# Patient Record
Sex: Male | Born: 1965
Health system: Southern US, Community
[De-identification: ages and names within clinical notes are randomized; demographics above are authoritative.]

## PROBLEM LIST (undated history)

## (undated) DIAGNOSIS — I10 Essential (primary) hypertension: Secondary | ICD-10-CM

## (undated) DIAGNOSIS — F1020 Alcohol dependence, uncomplicated: Secondary | ICD-10-CM

## (undated) HISTORY — PX: ORTHOPEDIC SURGERY: SHX850

---

## 2010-07-25 ENCOUNTER — Emergency Department (HOSPITAL_COMMUNITY)
Admission: EM | Admit: 2010-07-25 | Discharge: 2010-07-25 | Payer: Self-pay | Source: Home / Self Care | Admitting: Emergency Medicine

## 2011-02-14 ENCOUNTER — Emergency Department (HOSPITAL_COMMUNITY): Payer: BC Managed Care – PPO

## 2011-02-14 ENCOUNTER — Emergency Department (HOSPITAL_COMMUNITY)
Admission: EM | Admit: 2011-02-14 | Discharge: 2011-02-14 | Disposition: A | Discharge status: Home / Self Care | Payer: BC Managed Care – PPO | Attending: Emergency Medicine | Admitting: Emergency Medicine

## 2011-02-14 DIAGNOSIS — R0602 Shortness of breath: Secondary | ICD-10-CM | POA: Insufficient documentation

## 2011-02-14 DIAGNOSIS — R002 Palpitations: Secondary | ICD-10-CM | POA: Insufficient documentation

## 2011-02-14 DIAGNOSIS — I10 Essential (primary) hypertension: Secondary | ICD-10-CM | POA: Insufficient documentation

## 2011-02-14 DIAGNOSIS — R079 Chest pain, unspecified: Secondary | ICD-10-CM | POA: Insufficient documentation

## 2011-02-14 DIAGNOSIS — F1010 Alcohol abuse, uncomplicated: Secondary | ICD-10-CM | POA: Insufficient documentation

## 2011-02-14 LAB — HEPATIC FUNCTION PANEL
Albumin: 4.3 g/dL (ref 3.5–5.2)
Total Bilirubin: 1.1 mg/dL (ref 0.3–1.2)
Total Protein: 8.2 g/dL (ref 6.0–8.3)

## 2011-02-14 LAB — POCT I-STAT, CHEM 8
Chloride: 97 mEq/L (ref 96–112)
HCT: 57 % — ABNORMAL HIGH (ref 39.0–52.0)
Potassium: 4.2 mEq/L (ref 3.5–5.1)

## 2011-02-14 LAB — DIFFERENTIAL
Lymphocytes Relative: 15 % (ref 12–46)
Lymphs Abs: 1.4 10*3/uL (ref 0.7–4.0)
Neutrophils Relative %: 75 % (ref 43–77)

## 2011-02-14 LAB — CK TOTAL AND CKMB (NOT AT ARMC): Relative Index: 2.3 (ref 0.0–2.5)

## 2011-02-14 LAB — TROPONIN I: Troponin I: <0.3 ng/mL (ref <0.30)

## 2011-02-14 LAB — CBC
HCT: 49.6 % (ref 39.0–52.0)
MCV: 94.7 fL (ref 78.0–100.0)
RBC: 5.24 MIL/uL (ref 4.22–5.81)
WBC: 9.5 10*3/uL (ref 4.0–10.5)

## 2016-12-14 DIAGNOSIS — E7800 Pure hypercholesterolemia, unspecified: Secondary | ICD-10-CM | POA: Diagnosis not present

## 2016-12-14 DIAGNOSIS — Z0000 Encounter for general adult medical examination without abnormal findings: Secondary | ICD-10-CM | POA: Diagnosis not present

## 2017-04-22 DIAGNOSIS — I10 Essential (primary) hypertension: Secondary | ICD-10-CM | POA: Diagnosis not present

## 2017-11-01 DIAGNOSIS — I10 Essential (primary) hypertension: Secondary | ICD-10-CM | POA: Diagnosis not present

## 2018-01-02 DIAGNOSIS — I10 Essential (primary) hypertension: Secondary | ICD-10-CM | POA: Diagnosis not present

## 2018-01-02 DIAGNOSIS — R0789 Other chest pain: Secondary | ICD-10-CM | POA: Diagnosis not present

## 2018-01-02 DIAGNOSIS — R5383 Other fatigue: Secondary | ICD-10-CM | POA: Diagnosis not present

## 2018-01-24 DIAGNOSIS — I10 Essential (primary) hypertension: Secondary | ICD-10-CM | POA: Diagnosis not present

## 2018-01-24 DIAGNOSIS — R079 Chest pain, unspecified: Secondary | ICD-10-CM | POA: Diagnosis not present

## 2018-01-24 DIAGNOSIS — R5383 Other fatigue: Secondary | ICD-10-CM | POA: Diagnosis not present

## 2018-01-31 DIAGNOSIS — R079 Chest pain, unspecified: Secondary | ICD-10-CM | POA: Diagnosis not present

## 2018-02-02 DIAGNOSIS — I10 Essential (primary) hypertension: Secondary | ICD-10-CM | POA: Diagnosis not present

## 2019-11-19 ENCOUNTER — Ambulatory Visit: Payer: Self-pay | Attending: Internal Medicine

## 2019-11-19 DIAGNOSIS — Z23 Encounter for immunization: Secondary | ICD-10-CM

## 2019-11-19 NOTE — Progress Notes (Signed)
   Covid-19 Vaccination Clinic  Name:  Thierno Hun    MRN: [id number] DOB: [birth year]  11/19/2019  Mr. Crissman was observed post Covid-19 immunization for 15 minutes without incident. He was provided with Vaccine Information Sheet and instruction to access the V-Safe system.   Mr. Delay was instructed to call 911 with any severe reactions post vaccine: Marland Kitchen Difficulty breathing  . Swelling of face and throat  . A fast heartbeat  . A bad rash all over body  . Dizziness and weakness   Immunizations Administered    Name Date Dose VIS Date Route   Pfizer COVID-19 Vaccine 11/19/2019  3:10 PM 0.3 mL 07/27/2019 Intramuscular   Manufacturer: [employer], [employer]   Lot: [id number]   NDC: [address]-9

## 2019-12-12 ENCOUNTER — Ambulatory Visit: Payer: Self-pay | Attending: Internal Medicine

## 2019-12-12 DIAGNOSIS — Z23 Encounter for immunization: Secondary | ICD-10-CM

## 2019-12-12 NOTE — Progress Notes (Signed)
   Covid-19 Vaccination Clinic  Name:  Paul [name]    MRN: [id number] DOB: 12/09/1965  12/12/2019  Paul [name] was observed post Covid-19 immunization for 15 minutes without incident. He was provided with Vaccine Information Sheet and instruction to access the V-Safe system.   Paul [name] was instructed to call 911 with any severe reactions post vaccine: Marland Kitchen Difficulty breathing  . Swelling of face and throat  . A fast heartbeat  . A bad rash all over body  . Dizziness and weakness   Immunizations Administered    Name Date Dose VIS Date Route   Pfizer COVID-19 Vaccine 12/12/2019  2:57 PM 0.3 mL 10/10/2018 Intramuscular   Manufacturer: [employer], [employer]   Lot: [id number]   NDC: [address]-4

## 2020-08-20 ENCOUNTER — Ambulatory Visit (HOSPITAL_BASED_OUTPATIENT_CLINIC_OR_DEPARTMENT_OTHER)
Admission: RE | Admit: 2020-08-20 | Discharge: 2020-08-20 | Disposition: A | Discharge status: Home / Self Care | Payer: BC Managed Care – PPO | Source: Ambulatory Visit | Attending: Family Medicine | Admitting: Family Medicine

## 2020-08-20 ENCOUNTER — Encounter (HOSPITAL_COMMUNITY): Payer: Self-pay | Admitting: *Deleted

## 2020-08-20 ENCOUNTER — Inpatient Hospital Stay (HOSPITAL_COMMUNITY)
Admission: EM | Admit: 2020-08-20 | Discharge: 2020-08-22 | DRG: 433 | Disposition: A | Discharge status: Home / Self Care | Payer: BC Managed Care – PPO | Attending: Internal Medicine | Admitting: Internal Medicine

## 2020-08-20 ENCOUNTER — Other Ambulatory Visit: Payer: Self-pay

## 2020-08-20 ENCOUNTER — Other Ambulatory Visit (HOSPITAL_BASED_OUTPATIENT_CLINIC_OR_DEPARTMENT_OTHER): Payer: Self-pay | Admitting: Family Medicine

## 2020-08-20 DIAGNOSIS — R160 Hepatomegaly, not elsewhere classified: Secondary | ICD-10-CM | POA: Diagnosis not present

## 2020-08-20 DIAGNOSIS — I10 Essential (primary) hypertension: Secondary | ICD-10-CM | POA: Diagnosis not present

## 2020-08-20 DIAGNOSIS — K828 Other specified diseases of gallbladder: Secondary | ICD-10-CM | POA: Diagnosis not present

## 2020-08-20 DIAGNOSIS — R162 Hepatomegaly with splenomegaly, not elsewhere classified: Secondary | ICD-10-CM | POA: Diagnosis present

## 2020-08-20 DIAGNOSIS — R1011 Right upper quadrant pain: Secondary | ICD-10-CM

## 2020-08-20 DIAGNOSIS — Z800 Family history of malignant neoplasm of digestive organs: Secondary | ICD-10-CM | POA: Diagnosis not present

## 2020-08-20 DIAGNOSIS — Z125 Encounter for screening for malignant neoplasm of prostate: Secondary | ICD-10-CM | POA: Diagnosis not present

## 2020-08-20 DIAGNOSIS — Z79899 Other long term (current) drug therapy: Secondary | ICD-10-CM

## 2020-08-20 DIAGNOSIS — F17200 Nicotine dependence, unspecified, uncomplicated: Secondary | ICD-10-CM | POA: Diagnosis not present

## 2020-08-20 DIAGNOSIS — Z20822 Contact with and (suspected) exposure to covid-19: Secondary | ICD-10-CM | POA: Diagnosis present

## 2020-08-20 DIAGNOSIS — F1021 Alcohol dependence, in remission: Secondary | ICD-10-CM | POA: Diagnosis not present

## 2020-08-20 DIAGNOSIS — R7401 Elevation of levels of liver transaminase levels: Secondary | ICD-10-CM | POA: Diagnosis not present

## 2020-08-20 DIAGNOSIS — R932 Abnormal findings on diagnostic imaging of liver and biliary tract: Secondary | ICD-10-CM | POA: Diagnosis not present

## 2020-08-20 DIAGNOSIS — K7400 Hepatic fibrosis, unspecified: Secondary | ICD-10-CM | POA: Diagnosis present

## 2020-08-20 DIAGNOSIS — I8500 Esophageal varices without bleeding: Secondary | ICD-10-CM | POA: Diagnosis not present

## 2020-08-20 DIAGNOSIS — I8510 Secondary esophageal varices without bleeding: Secondary | ICD-10-CM | POA: Diagnosis present

## 2020-08-20 DIAGNOSIS — F10239 Alcohol dependence with withdrawal, unspecified: Secondary | ICD-10-CM | POA: Diagnosis not present

## 2020-08-20 DIAGNOSIS — K8020 Calculus of gallbladder without cholecystitis without obstruction: Secondary | ICD-10-CM | POA: Diagnosis not present

## 2020-08-20 DIAGNOSIS — K766 Portal hypertension: Secondary | ICD-10-CM | POA: Diagnosis not present

## 2020-08-20 DIAGNOSIS — Z8052 Family history of malignant neoplasm of bladder: Secondary | ICD-10-CM | POA: Diagnosis not present

## 2020-08-20 DIAGNOSIS — K700 Alcoholic fatty liver: Secondary | ICD-10-CM | POA: Diagnosis not present

## 2020-08-20 DIAGNOSIS — K7030 Alcoholic cirrhosis of liver without ascites: Secondary | ICD-10-CM | POA: Diagnosis not present

## 2020-08-20 DIAGNOSIS — R109 Unspecified abdominal pain: Secondary | ICD-10-CM | POA: Diagnosis not present

## 2020-08-20 DIAGNOSIS — K8000 Calculus of gallbladder with acute cholecystitis without obstruction: Secondary | ICD-10-CM | POA: Diagnosis not present

## 2020-08-20 DIAGNOSIS — E871 Hypo-osmolality and hyponatremia: Secondary | ICD-10-CM | POA: Diagnosis present

## 2020-08-20 DIAGNOSIS — F1090 Alcohol use, unspecified, uncomplicated: Secondary | ICD-10-CM

## 2020-08-20 DIAGNOSIS — K810 Acute cholecystitis: Secondary | ICD-10-CM | POA: Diagnosis present

## 2020-08-20 DIAGNOSIS — Z7289 Other problems related to lifestyle: Secondary | ICD-10-CM

## 2020-08-20 DIAGNOSIS — R7989 Other specified abnormal findings of blood chemistry: Secondary | ICD-10-CM | POA: Diagnosis present

## 2020-08-20 DIAGNOSIS — F1020 Alcohol dependence, uncomplicated: Secondary | ICD-10-CM | POA: Diagnosis not present

## 2020-08-20 DIAGNOSIS — K709 Alcoholic liver disease, unspecified: Principal | ICD-10-CM | POA: Diagnosis present

## 2020-08-20 DIAGNOSIS — K760 Fatty (change of) liver, not elsewhere classified: Secondary | ICD-10-CM | POA: Diagnosis not present

## 2020-08-20 DIAGNOSIS — F17290 Nicotine dependence, other tobacco product, uncomplicated: Secondary | ICD-10-CM | POA: Diagnosis not present

## 2020-08-20 DIAGNOSIS — Z825 Family history of asthma and other chronic lower respiratory diseases: Secondary | ICD-10-CM | POA: Diagnosis not present

## 2020-08-20 DIAGNOSIS — K7010 Alcoholic hepatitis without ascites: Secondary | ICD-10-CM | POA: Diagnosis not present

## 2020-08-20 DIAGNOSIS — R17 Unspecified jaundice: Secondary | ICD-10-CM | POA: Diagnosis not present

## 2020-08-20 HISTORY — DX: Essential (primary) hypertension: I10

## 2020-08-20 HISTORY — DX: Alcohol dependence, uncomplicated: F10.20

## 2020-08-20 LAB — COMPREHENSIVE METABOLIC PANEL
ALT: 45 U/L — ABNORMAL HIGH (ref 0–44)
AST: 124 U/L — ABNORMAL HIGH (ref 15–41)
Albumin: 2.6 g/dL — ABNORMAL LOW (ref 3.5–5.0)
Alkaline Phosphatase: 229 U/L — ABNORMAL HIGH (ref 38–126)
Anion gap: 11 (ref 5–15)
BUN: 9 mg/dL (ref 6–20)
CO2: 26 mmol/L (ref 22–32)
Calcium: 8.6 mg/dL — ABNORMAL LOW (ref 8.9–10.3)
Chloride: 94 mmol/L — ABNORMAL LOW (ref 98–111)
Creatinine, Ser: 0.87 mg/dL (ref 0.61–1.24)
GFR, Estimated: >60 mL/min (ref >60)
Glucose, Bld: 94 mg/dL (ref 70–99)
Potassium: 4 mmol/L (ref 3.5–5.1)
Sodium: 131 mmol/L — ABNORMAL LOW (ref 135–145)
Total Bilirubin: 13 mg/dL — ABNORMAL HIGH (ref 0.3–1.2)
Total Protein: 7.3 g/dL (ref 6.5–8.1)

## 2020-08-20 LAB — CBC
HCT: 41.4 % (ref 39.0–52.0)
Hemoglobin: 14.9 g/dL (ref 13.0–17.0)
MCH: 32.3 pg (ref 26.0–34.0)
MCHC: 36 g/dL (ref 30.0–36.0)
MCV: 89.6 fL (ref 80.0–100.0)
Platelets: 279 10*3/uL (ref 150–400)
RBC: 4.62 MIL/uL (ref 4.22–5.81)
RDW: 17.2 % — ABNORMAL HIGH (ref 11.5–15.5)
WBC: 7.7 10*3/uL (ref 4.0–10.5)
nRBC: 0 % (ref 0.0–0.2)

## 2020-08-20 LAB — URINALYSIS, ROUTINE W REFLEX MICROSCOPIC
Glucose, UA: NEGATIVE mg/dL
Hgb urine dipstick: NEGATIVE
Ketones, ur: NEGATIVE mg/dL
Leukocytes,Ua: NEGATIVE
Nitrite: NEGATIVE
Protein, ur: 30 mg/dL — AB
Specific Gravity, Urine: 1.024 (ref 1.005–1.030)
pH: 5 (ref 5.0–8.0)

## 2020-08-20 LAB — LIPASE, BLOOD: Lipase: 30 U/L (ref 11–51)

## 2020-08-20 NOTE — ED Triage Notes (Signed)
The pt drinks alcohol every day he last drank any was 3 days ago  He saw his doctor today that sent him here after some abnormal labs  He reports abd pain and his skin looks jaundice  No n v or diarrhea

## 2020-08-21 ENCOUNTER — Inpatient Hospital Stay (HOSPITAL_COMMUNITY): Payer: BC Managed Care – PPO

## 2020-08-21 ENCOUNTER — Encounter (HOSPITAL_COMMUNITY): Payer: Self-pay | Admitting: Internal Medicine

## 2020-08-21 DIAGNOSIS — I8510 Secondary esophageal varices without bleeding: Secondary | ICD-10-CM | POA: Diagnosis present

## 2020-08-21 DIAGNOSIS — K700 Alcoholic fatty liver: Secondary | ICD-10-CM | POA: Diagnosis not present

## 2020-08-21 DIAGNOSIS — F1020 Alcohol dependence, uncomplicated: Secondary | ICD-10-CM | POA: Diagnosis not present

## 2020-08-21 DIAGNOSIS — K766 Portal hypertension: Secondary | ICD-10-CM | POA: Diagnosis present

## 2020-08-21 DIAGNOSIS — Z800 Family history of malignant neoplasm of digestive organs: Secondary | ICD-10-CM | POA: Diagnosis not present

## 2020-08-21 DIAGNOSIS — K709 Alcoholic liver disease, unspecified: Secondary | ICD-10-CM | POA: Diagnosis present

## 2020-08-21 DIAGNOSIS — R7989 Other specified abnormal findings of blood chemistry: Secondary | ICD-10-CM | POA: Diagnosis present

## 2020-08-21 DIAGNOSIS — Z20822 Contact with and (suspected) exposure to covid-19: Secondary | ICD-10-CM | POA: Diagnosis present

## 2020-08-21 DIAGNOSIS — K8000 Calculus of gallbladder with acute cholecystitis without obstruction: Secondary | ICD-10-CM | POA: Diagnosis present

## 2020-08-21 DIAGNOSIS — K828 Other specified diseases of gallbladder: Secondary | ICD-10-CM | POA: Diagnosis present

## 2020-08-21 DIAGNOSIS — I10 Essential (primary) hypertension: Secondary | ICD-10-CM | POA: Diagnosis present

## 2020-08-21 DIAGNOSIS — R162 Hepatomegaly with splenomegaly, not elsewhere classified: Secondary | ICD-10-CM | POA: Diagnosis present

## 2020-08-21 DIAGNOSIS — K810 Acute cholecystitis: Secondary | ICD-10-CM | POA: Diagnosis present

## 2020-08-21 DIAGNOSIS — Z825 Family history of asthma and other chronic lower respiratory diseases: Secondary | ICD-10-CM | POA: Diagnosis not present

## 2020-08-21 DIAGNOSIS — K760 Fatty (change of) liver, not elsewhere classified: Secondary | ICD-10-CM | POA: Diagnosis present

## 2020-08-21 DIAGNOSIS — F17200 Nicotine dependence, unspecified, uncomplicated: Secondary | ICD-10-CM | POA: Diagnosis present

## 2020-08-21 DIAGNOSIS — R109 Unspecified abdominal pain: Secondary | ICD-10-CM | POA: Diagnosis present

## 2020-08-21 DIAGNOSIS — K7400 Hepatic fibrosis, unspecified: Secondary | ICD-10-CM | POA: Diagnosis present

## 2020-08-21 DIAGNOSIS — Z8052 Family history of malignant neoplasm of bladder: Secondary | ICD-10-CM | POA: Diagnosis not present

## 2020-08-21 DIAGNOSIS — F17290 Nicotine dependence, other tobacco product, uncomplicated: Secondary | ICD-10-CM

## 2020-08-21 DIAGNOSIS — F10239 Alcohol dependence with withdrawal, unspecified: Secondary | ICD-10-CM | POA: Diagnosis present

## 2020-08-21 DIAGNOSIS — Z79899 Other long term (current) drug therapy: Secondary | ICD-10-CM | POA: Diagnosis not present

## 2020-08-21 DIAGNOSIS — E871 Hypo-osmolality and hyponatremia: Secondary | ICD-10-CM | POA: Diagnosis present

## 2020-08-21 LAB — RESP PANEL BY RT-PCR (FLU A&B, COVID) ARPGX2
Influenza A by PCR: NEGATIVE
Influenza B by PCR: NEGATIVE
SARS Coronavirus 2 by RT PCR: NEGATIVE

## 2020-08-21 LAB — HEPATITIS PANEL, ACUTE
HCV Ab: NONREACTIVE
Hep A IgM: NONREACTIVE
Hep B C IgM: NONREACTIVE
Hepatitis B Surface Ag: NONREACTIVE

## 2020-08-21 LAB — ACETAMINOPHEN LEVEL: Acetaminophen (Tylenol), Serum: <10 ug/mL — ABNORMAL LOW (ref 10–30)

## 2020-08-21 LAB — COMPREHENSIVE METABOLIC PANEL
ALT: 44 U/L (ref 0–44)
AST: 119 U/L — ABNORMAL HIGH (ref 15–41)
Albumin: 2.6 g/dL — ABNORMAL LOW (ref 3.5–5.0)
Alkaline Phosphatase: 209 U/L — ABNORMAL HIGH (ref 38–126)
Anion gap: 10 (ref 5–15)
BUN: 11 mg/dL (ref 6–20)
CO2: 24 mmol/L (ref 22–32)
Calcium: 8.4 mg/dL — ABNORMAL LOW (ref 8.9–10.3)
Chloride: 96 mmol/L — ABNORMAL LOW (ref 98–111)
Creatinine, Ser: 0.73 mg/dL (ref 0.61–1.24)
GFR, Estimated: >60 mL/min (ref >60)
Glucose, Bld: 90 mg/dL (ref 70–99)
Potassium: 3.8 mmol/L (ref 3.5–5.1)
Sodium: 130 mmol/L — ABNORMAL LOW (ref 135–145)
Total Bilirubin: 13 mg/dL — ABNORMAL HIGH (ref 0.3–1.2)
Total Protein: 7 g/dL (ref 6.5–8.1)

## 2020-08-21 LAB — CBC WITH DIFFERENTIAL/PLATELET
Abs Immature Granulocytes: 0.04 10*3/uL (ref 0.00–0.07)
Basophils Absolute: 0.1 10*3/uL (ref 0.0–0.1)
Basophils Relative: 1 %
Eosinophils Absolute: 0.1 10*3/uL (ref 0.0–0.5)
Eosinophils Relative: 2 %
HCT: 38.2 % — ABNORMAL LOW (ref 39.0–52.0)
Hemoglobin: 13.8 g/dL (ref 13.0–17.0)
Immature Granulocytes: 1 %
Lymphocytes Relative: 18 %
Lymphs Abs: 1.3 10*3/uL (ref 0.7–4.0)
MCH: 32.6 pg (ref 26.0–34.0)
MCHC: 36.1 g/dL — ABNORMAL HIGH (ref 30.0–36.0)
MCV: 90.3 fL (ref 80.0–100.0)
Monocytes Absolute: 0.8 10*3/uL (ref 0.1–1.0)
Monocytes Relative: 11 %
Neutro Abs: 4.8 10*3/uL (ref 1.7–7.7)
Neutrophils Relative %: 67 %
Platelets: 268 10*3/uL (ref 150–400)
RBC: 4.23 MIL/uL (ref 4.22–5.81)
RDW: 17.4 % — ABNORMAL HIGH (ref 11.5–15.5)
WBC: 7.1 10*3/uL (ref 4.0–10.5)
nRBC: 0 % (ref 0.0–0.2)

## 2020-08-21 LAB — PROTIME-INR
INR: 1.2 (ref 0.8–1.2)
Prothrombin Time: 15 seconds (ref 11.4–15.2)

## 2020-08-21 LAB — HIV ANTIBODY (ROUTINE TESTING W REFLEX): HIV Screen 4th Generation wRfx: NONREACTIVE

## 2020-08-21 MED ORDER — MORPHINE SULFATE (PF) 2 MG/ML IV SOLN: 2.0000 mg | INTRAVENOUS | Status: DC | PRN

## 2020-08-21 MED ORDER — THIAMINE HCL 100 MG/ML IJ SOLN: 100.0000 mg | Freq: Every day | INTRAMUSCULAR | Status: DC

## 2020-08-21 MED ORDER — ACETAMINOPHEN 325 MG PO TABS: 650.0000 mg | ORAL_TABLET | Freq: Four times a day (QID) | ORAL | Status: DC | PRN

## 2020-08-21 MED ORDER — THIAMINE HCL 100 MG PO TABS
100.0000 mg | ORAL_TABLET | Freq: Every day | ORAL | Status: DC
  Administered 2020-08-21: 100 mg via ORAL
  Filled 2020-08-21 (×2): qty 1

## 2020-08-21 MED ORDER — SODIUM CHLORIDE 0.9 % IV SOLN
2.0000 g | INTRAVENOUS | Status: DC
  Administered 2020-08-21: 2 g via INTRAVENOUS
  Filled 2020-08-21: qty 20

## 2020-08-21 MED ORDER — LORAZEPAM 1 MG PO TABS: 0.0000 mg | ORAL_TABLET | Freq: Four times a day (QID) | ORAL | Status: DC

## 2020-08-21 MED ORDER — DIPHENHYDRAMINE HCL 50 MG/ML IJ SOLN
25.0000 mg | Freq: Once | INTRAMUSCULAR | Status: AC
  Administered 2020-08-21: 25 mg via INTRAVENOUS
  Filled 2020-08-21: qty 1

## 2020-08-21 MED ORDER — ONDANSETRON 4 MG PO TBDP: 4.0000 mg | ORAL_TABLET | Freq: Four times a day (QID) | ORAL | Status: DC | PRN

## 2020-08-21 MED ORDER — LORAZEPAM 1 MG PO TABS
1.0000 mg | ORAL_TABLET | ORAL | Status: DC | PRN
  Administered 2020-08-22: 1 mg via ORAL
  Filled 2020-08-21: qty 1

## 2020-08-21 MED ORDER — AMLODIPINE BESYLATE 10 MG PO TABS
10.0000 mg | ORAL_TABLET | Freq: Every day | ORAL | Status: DC
  Filled 2020-08-21: qty 1

## 2020-08-21 MED ORDER — FOLIC ACID 1 MG PO TABS
1.0000 mg | ORAL_TABLET | Freq: Every day | ORAL | Status: DC
  Administered 2020-08-21: 1 mg via ORAL
  Filled 2020-08-21 (×2): qty 1

## 2020-08-21 MED ORDER — LORAZEPAM 2 MG/ML IJ SOLN: 0.0000 mg | Freq: Two times a day (BID) | INTRAMUSCULAR | Status: DC

## 2020-08-21 MED ORDER — LORAZEPAM 1 MG PO TABS: 0.0000 mg | ORAL_TABLET | Freq: Two times a day (BID) | ORAL | Status: DC

## 2020-08-21 MED ORDER — LORAZEPAM 2 MG/ML IJ SOLN: 0.0000 mg | Freq: Four times a day (QID) | INTRAMUSCULAR | Status: DC

## 2020-08-21 MED ORDER — HYDRALAZINE HCL 20 MG/ML IJ SOLN: 10.0000 mg | INTRAMUSCULAR | Status: DC | PRN

## 2020-08-21 MED ORDER — LORAZEPAM 2 MG/ML IJ SOLN
1.0000 mg | Freq: Once | INTRAMUSCULAR | Status: AC
  Administered 2020-08-21: 1 mg via INTRAVENOUS

## 2020-08-21 MED ORDER — OXYCODONE HCL 5 MG PO TABS: 5.0000 mg | ORAL_TABLET | ORAL | Status: DC | PRN

## 2020-08-21 MED ORDER — ONDANSETRON HCL 4 MG/2ML IJ SOLN: 4.0000 mg | Freq: Four times a day (QID) | INTRAMUSCULAR | Status: DC | PRN

## 2020-08-21 MED ORDER — LORAZEPAM 2 MG/ML IJ SOLN
1.0000 mg | INTRAMUSCULAR | Status: DC | PRN
  Filled 2020-08-21: qty 1

## 2020-08-21 MED ORDER — GADOBUTROL 1 MMOL/ML IV SOLN
7.0000 mL | Freq: Once | INTRAVENOUS | Status: AC | PRN
  Administered 2020-08-21: 7 mL via INTRAVENOUS

## 2020-08-21 MED ORDER — LACTATED RINGERS IV SOLN: INTRAVENOUS | Status: DC

## 2020-08-21 MED ORDER — THIAMINE HCL 100 MG PO TABS: 100.0000 mg | ORAL_TABLET | Freq: Every day | ORAL | Status: DC

## 2020-08-21 MED ORDER — ACETAMINOPHEN 650 MG RE SUPP: 650.0000 mg | Freq: Four times a day (QID) | RECTAL | Status: DC | PRN

## 2020-08-21 MED ORDER — ADULT MULTIVITAMIN W/MINERALS CH
1.0000 | ORAL_TABLET | Freq: Every day | ORAL | Status: DC
  Administered 2020-08-21: 1 via ORAL
  Filled 2020-08-21 (×2): qty 1

## 2020-08-21 NOTE — Progress Notes (Signed)
NEW ADMISSION NOTE New Admission Note:   Arrival Method: Stretcher  Mental Orientation: axox4 Telemetry: none ordered  Assessment: Completed Skin: see skin assessment  IV: right FA-infusing  Pain: denies  Tubes: none Safety Measures: Safety Fall Prevention Plan has been discussed Admission: To be Completed [facility] Orientation: Patient has been orientated to the room, unit and staff.  Family: wife at the bedside   Orders have been reviewed and implemented. Will continue to monitor the patient. Call light has been placed within reach and bed alarm has been activated.   Leonia Reeves, RN, BSN

## 2020-08-21 NOTE — ED Notes (Signed)
Pt transported to MRI 

## 2020-08-21 NOTE — Consult Note (Signed)
Referring Provider: ED Primary Care Physician:  Deboraha Sprang MDs  Reason for Consultation:  Abnormal LFTs, jaundice  HPI: Morey Andonian is a 55 y.o. male with past medical history of alcohol use presenting for consultation of jaundice and abnormal LFTs.  Patient states he started having slight yellowing of the skin and eyes last week.  However, this acutely worsened on Tuesday 1/4.  He notes intermittent biliary colic which has occurred for the past year, typically following meals.  Over the past week, he reports RUQ abdominal pain following any meal.  He also reports nausea but denies any vomiting.  Denies changes in appetite, early satiety, unexplained weight loss, GERD, dysphagia, changes in bowel movements, melena, or hematochezia.  He drinks a fifth of liquor per day and has done so for the past year.  Denies Tylenol use, new medications, herbal medications, prior IV drug use, or skin tattoos.  Denies NSAID, aspirin, or blood thinner use.  Denies family history of liver disease or any gastrointestinal disorder or malignancy.  Past Medical History:  Diagnosis Date  . Alcohol dependence (HCC)   . Hypertension     Past Surgical History:  Procedure Laterality Date  . ORTHOPEDIC SURGERY      Prior to Admission medications   Medication Sig Start Date End Date Taking? Authorizing Provider  amLODipine (NORVASC) 10 MG tablet Take 10 mg by mouth daily. 08/20/20  Yes [provider]    Scheduled Meds: . [START ON 08/22/2020] amLODipine  10 mg Oral Daily  . folic acid  1 mg Oral Daily  . multivitamin with minerals  1 tablet Oral Daily  . thiamine  100 mg Oral Daily   Or  . thiamine  100 mg Intravenous Daily   Continuous Infusions: . cefTRIAXone (ROCEPHIN)  IV    . lactated ringers     PRN Meds:.acetaminophen **OR** acetaminophen, hydrALAZINE, LORazepam **OR** LORazepam, morphine injection, ondansetron **OR** ondansetron (ZOFRAN) IV, oxyCODONE  Allergies as of 08/20/2020  . (No  Known Allergies)    Family History  Problem Relation Age of Onset  . COPD Mother   . Bladder Cancer Father   . Esophageal cancer Maternal Grandmother     Social History   Socioeconomic History  . Marital status: Married    Spouse name: Not on file  . Number of children: Not on file  . Years of education: Not on file  . Highest education level: Not on file  Occupational History  . Not on file  Tobacco Use  . Smoking status: Never Smoker  . Smokeless tobacco: Former Neurosurgeon    Types: Snuff  . Tobacco comment: uses nicotine lozenges  Substance and Sexual Activity  . Alcohol use: Yes  . Drug use: Not Currently  . Sexual activity: Not on file  Other Topics Concern  . Not on file  Social History Narrative  . Not on file   Social Determinants of Health   Financial Resource Strain: Not on file  Food Insecurity: Not on file  Transportation Needs: Not on file  Physical Activity: Not on file  Stress: Not on file  Social Connections: Not on file  Intimate Partner Violence: Not on file    Review of Systems: Review of Systems  Constitutional: Negative for chills, fever and weight loss.  HENT: Negative for hearing loss and tinnitus.   Eyes: Negative for pain and redness.  Respiratory: Negative for cough and shortness of breath.   Cardiovascular: Negative for chest pain and palpitations.  Gastrointestinal: Positive for abdominal  pain and nausea. Negative for blood in stool, constipation, diarrhea, heartburn, melena and vomiting.  Genitourinary: Negative for flank pain and hematuria.  Musculoskeletal: Negative for falls and joint pain.  Skin: Negative for itching and rash.  Neurological: Negative for seizures and loss of consciousness.  Endo/Heme/Allergies: Negative for polydipsia. Does not bruise/bleed easily.  Psychiatric/Behavioral: Positive for substance abuse. The patient is not nervous/anxious.      Physical Exam: Vital signs: Vitals:   08/21/20 1022 08/21/20 1027   BP: 117/78 117/78  Pulse: 67 65  Resp: 14 11  Temp:  98.6 F (37 C)  SpO2: 95% 97%     Physical Exam Vitals reviewed.  Constitutional:      General: He is not in acute distress. HENT:     Head: Normocephalic and atraumatic.     Nose: Nose normal. No congestion.     Mouth/Throat:     Mouth: Mucous membranes are moist.     Pharynx: Oropharynx is clear.  Eyes:     General: Scleral icterus present.     Extraocular Movements: Extraocular movements intact.  Cardiovascular:     Rate and Rhythm: Normal rate and regular rhythm.     Pulses: Normal pulses.  Pulmonary:     Effort: Pulmonary effort is normal. No respiratory distress.     Breath sounds: Normal breath sounds.  Abdominal:     General: Bowel sounds are normal. There is no distension.     Palpations: Abdomen is soft. There is no mass.     Tenderness: There is abdominal tenderness (RUQ, mild). There is no guarding or rebound.     Hernia: No hernia is present.  Musculoskeletal:        General: No swelling or tenderness.     Cervical back: Normal range of motion and neck supple.  Skin:    General: Skin is warm and dry.     Coloration: Skin is jaundiced.  Neurological:     General: No focal deficit present.     Mental Status: He is oriented to person, place, and time. He is lethargic.  Psychiatric:        Mood and Affect: Mood normal.        Behavior: Behavior normal. Behavior is cooperative.      GI:  Lab Results: Recent Labs    08/20/20 2005  WBC 7.7  HGB 14.9  HCT 41.4  PLT 279   BMET Recent Labs    08/20/20 2005  NA 131*  K 4.0  CL 94*  CO2 26  GLUCOSE 94  BUN 9  CREATININE 0.87  CALCIUM 8.6*   LFT Recent Labs    08/20/20 2005  PROT 7.3  ALBUMIN 2.6*  AST 124*  ALT 45*  ALKPHOS 229*  BILITOT 13.0*   PT/INR No results for input(s): LABPROT, INR in the last 72 hours.   Studies/Results: US Abdomen Limited RUQ (LIVER/GB)  Result Date: 08/20/2020 CLINICAL DATA:  Right upper quadrant  abdominal pain. EXAM: ULTRASOUND ABDOMEN LIMITED RIGHT UPPER QUADRANT COMPARISON:  None. FINDINGS: Gallbladder: There is diffuse gallbladder wall thickening with areas of the gallbladder wall measuring up to approximately 7 mm in thickness. There is pericholecystic free fluid. There is sludge with multiple small gallstones. The sonographic Percell Miller sign is negative. Common bile duct: Diameter: 4 mm. Liver: Diffuse increased echogenicity with slightly heterogeneous liver. Appearance typically secondary to fatty infiltration. Fibrosis secondary consideration. No secondary findings of cirrhosis noted. No focal hepatic lesion or intrahepatic biliary duct dilatation. Portal vein  is patent on color Doppler imaging with normal direction of blood flow towards the liver. Other: None. IMPRESSION: 1. Gallbladder wall thickening pericholecystic free fluid with gallbladder sludge and small stones is suspicious for acute cholecystitis in the appropriate clinical setting, however the sonographic Eulah Pont sign is reported as negative. If there is high clinical suspicion for acute cholecystitis, follow-up with HIDA scan is recommended. 2. Hepatomegaly with findings suggestive of hepatic steatosis. These results will be called to the ordering clinician or representative by the Radiologist Assistant, and communication documented in the PACS or [employer]. Electronically Signed   By: Katherine Mantle M.D.   On: 08/20/2020 17:12    Impression: Jaundice and abnormal LFTs: Clinical picture concerning for choledocholithiasis versus alcoholic hepatitis. -T bili 13.0/AST 124/ALT 45/ALP 229 as of 08/20/2020 -PT/INR and today's CMP are pending -Hepatitis panel pending -Ultrasound yesterday revealed gallbladder wall thickening pericholecystic free fluid with gallbladder sludge and small stones is suspicious for acute cholecystitis in the appropriate clinical setting, however the sonographic Eulah Pont sign is reported as negative.   Hepatomegaly with hepatic steatosis also noted.  Plan: MRI/MRCP today.    We will calculate hepatic discriminant function once PT/INR is resulted.  If MRI/MRCP positive for choledocholithiasis, we will plan to proceed with ERCP.  I thoroughly discussed ERCP with the patient to include nature, alternatives, benefits, and risks (including but not limited to bleeding, infection, perforation, pancreatitis which can prolong hospitalization or be fatal).  Patient verbalized understanding and gave verbal consent to proceed with ERCP if indicated.  Continue to trend LFTs.  After imaging, okay to have clear liquids or full liquids with n.p.o. after midnight.  [facility] will follow.   LOS: 0 days   Edrick Kins  PA-C 08/21/2020, 11:44 AM  Contact #  [phone]

## 2020-08-21 NOTE — ED Provider Notes (Signed)
[facility] EMERGENCY DEPARTMENT Provider Note   CSN: [id number] Arrival date & time: 08/20/20  1912     History Chief Complaint  Patient presents with  . Abdominal Pain    Paul [name] is a 55 y.o. male.  Patient presents to the emergency department for evaluation of right upper quadrant pain and jaundice.  Patient had a illness starting about 08/11/2020 with abdominal pain and nausea.  Patient reports decreased appetite since that time and increased right upper quadrant pain with eating.  Patient had been drinking alcohol heavily for about a year.  He states that he drank about 1/5 of liquor per day.  Patient had tapered down his alcohol use and stop drinking approximately 4 days ago.  This is about the time that he noted that his skin and eyes became yellow.  Patient went to his primary care doctor yesterday who ordered labs and a right upper quadrant ultrasound.  Patient was referred to the emergency department due to these results and has had an extended wait time.  The more he fasts, the better his right upper quadrant pain is.  His wife actually states that his skin had been more yellow than it is now.  No vomiting or fevers.  Patient denies any Tylenol use.  No recent travel.        Past Medical History:  Diagnosis Date  . Hypertension     There are no problems to display for this patient.   History reviewed. No pertinent surgical history.     No family history on file.  Social History   Tobacco Use  . Smoking status: Never Smoker  . Smokeless tobacco: Never Used  Substance Use Topics  . Drug use: Yes    Home Medications Prior to Admission medications   Not on File    Allergies    Patient has no known allergies.  Review of Systems   Review of Systems  Constitutional: Negative for fever.  HENT: Negative for rhinorrhea and sore throat.   Eyes: Negative for redness.  Respiratory: Negative for cough and shortness of breath.    Cardiovascular: Negative for chest pain.  Gastrointestinal: Positive for abdominal pain and nausea. Negative for diarrhea and vomiting.  Genitourinary: Negative for dysuria and hematuria.  Musculoskeletal: Negative for myalgias.  Skin: Positive for color change. Negative for rash.  Neurological: Negative for headaches.    Physical Exam Updated Vital Signs BP 115/79 (BP Location: Right Arm)   Pulse 71   Temp 98.2 F (36.8 C) (Oral)   Resp 20   Ht 5\' 10"  (1.778 m)   Wt 77.1 kg   SpO2 96%   BMI 24.39 kg/m   Physical Exam Vitals and nursing note reviewed.  Constitutional:      Appearance: He is well-developed and well-nourished.  HENT:     Head: Normocephalic and atraumatic.  Eyes:     General: Scleral icterus present.        Right eye: No discharge.        Left eye: No discharge.     Conjunctiva/sclera: Conjunctivae normal.  Cardiovascular:     Rate and Rhythm: Normal rate and regular rhythm.     Heart sounds: Normal heart sounds.  Pulmonary:     Effort: Pulmonary effort is normal.     Breath sounds: Normal breath sounds.  Abdominal:     Palpations: Abdomen is soft.     Tenderness: There is abdominal tenderness (mild) in the right upper quadrant. There is  no guarding or rebound. Negative signs include Murphy's sign.  Musculoskeletal:     Cervical back: Normal range of motion and neck supple.  Skin:    General: Skin is warm and dry.     Coloration: Skin is jaundiced.  Neurological:     Mental Status: He is alert.  Psychiatric:        Mood and Affect: Mood and affect normal.     ED Results / Procedures / Treatments   Labs (all labs ordered are listed, but only abnormal results are displayed) Labs Reviewed  COMPREHENSIVE METABOLIC PANEL - Abnormal; Notable for the following components:      Result Value   Sodium 131 (*)    Chloride 94 (*)    Calcium 8.6 (*)    Albumin 2.6 (*)    AST 124 (*)    ALT 45 (*)    Alkaline Phosphatase 229 (*)    Total Bilirubin  13.0 (*)    All other components within normal limits  CBC - Abnormal; Notable for the following components:   RDW 17.2 (*)    All other components within normal limits  URINALYSIS, ROUTINE W REFLEX MICROSCOPIC - Abnormal; Notable for the following components:   Color, Urine AMBER (*)    APPearance HAZY (*)    Bilirubin Urine MODERATE (*)    Protein, ur 30 (*)    Bacteria, UA RARE (*)    Non Squamous Epithelial 6-10 (*)    All other components within normal limits  RESP PANEL BY RT-PCR (FLU A&B, COVID) ARPGX2  LIPASE, BLOOD  PROTIME-INR  ACETAMINOPHEN LEVEL  HEPATITIS PANEL, ACUTE    EKG None  Radiology US Abdomen Limited RUQ (LIVER/GB)  Result Date: 08/20/2020 CLINICAL DATA:  Right upper quadrant abdominal pain. EXAM: ULTRASOUND ABDOMEN LIMITED RIGHT UPPER QUADRANT COMPARISON:  None. FINDINGS: Gallbladder: There is diffuse gallbladder wall thickening with areas of the gallbladder wall measuring up to approximately 7 mm in thickness. There is pericholecystic free fluid. There is sludge with multiple small gallstones. The sonographic Eulah Pont sign is negative. Common bile duct: Diameter: 4 mm. Liver: Diffuse increased echogenicity with slightly heterogeneous liver. Appearance typically secondary to fatty infiltration. Fibrosis secondary consideration. No secondary findings of cirrhosis noted. No focal hepatic lesion or intrahepatic biliary duct dilatation. Portal vein is patent on color Doppler imaging with normal direction of blood flow towards the liver. Other: None. IMPRESSION: 1. Gallbladder wall thickening pericholecystic free fluid with gallbladder sludge and small stones is suspicious for acute cholecystitis in the appropriate clinical setting, however the sonographic Eulah Pont sign is reported as negative. If there is high clinical suspicion for acute cholecystitis, follow-up with HIDA scan is recommended. 2. Hepatomegaly with findings suggestive of hepatic steatosis. These results will be  called to the ordering clinician or representative by the Radiologist Assistant, and communication documented in the PACS or [employer]. Electronically Signed   By: Katherine Mantle M.D.   On: 08/20/2020 17:12    Procedures Procedures (including critical care time)  Medications Ordered in ED Medications - No data to display  ED Course  I have reviewed the triage vital signs and the nursing notes.  Pertinent labs & imaging results that were available during my care of the patient were reviewed by me and considered in my medical decision making (see chart for details).  Patient seen and examined. Work-up reviewed/additional labs ordered.  Will consult general surgery over the concern for acute cholecystitis, however patient may need GI evaluation as well.  Vital signs reviewed and are as follows: BP 115/79 (BP Location: Right Arm)   Pulse 71   Temp 98.2 F (36.8 C) (Oral)   Resp 20   Ht 5\' 10"  (1.778 m)   Wt 77.1 kg   SpO2 96%   BMI 24.39 kg/m   10:27 AM Spoke with Will PA-C of general surgery. They wil consult. Requests medical admission, GI consultation.   GI consultation requested from Dr. Marlyne Beards with [facility] by secure chat.   Will consult [facility] for admission.   10:38 AM Discussed with Dr. Deboraha Sprang.      MDM Rules/Calculators/A&P                          Admit.    Final Clinical Impression(s) / ED Diagnoses Final diagnoses:  Hyperbilirubinemia  Right upper quadrant pain  Chronic alcohol use    Rx / DC Orders ED Discharge Orders    None       Ophelia Charter, PA-C 08/21/20 1039    [place], Adam, DO 08/21/20 1457

## 2020-08-21 NOTE — H&P (Addendum)
History and Physical    Paul [name] KZS:[id number] DOB: 08/01/1966 DOA: 08/20/2020  PCP:  [facility]ally [facility] Consultants:  None Patient coming from:  Home - lives with wife; Paul [name]: Wife, [phone]  Chief Complaint: Abdominal pain  HPI: Paul [name] is a 55 y.o. male with medical history significant of HTN and ETOH dependence presenting with abdominal pain.  He noticed a yellow tinge to his face about 4 days ago.  It did not get better.  Occasional "gall bladder pain following meals" periodically.  It has been much more uncomfortable and frequent the last 3 days.  Tuesday night, his eyes were yellow and his skin was yellow.  He hadn't been eating and had been nauseated and so they stayed home NYE thinking maybe he had a stomach virus.  He has had intermittent RUQ pain for maybe a year, a twinge maybe once every 3 weeks.  This also coincided with stopping drinking after a year of excessive drinking.  He was a social drinker but cared for his dad for 3 months at the end of his life.  He has a h/o periodic ETOH abuse (last time was maybe 9 years ago), no problem in years.  In the last year he was drinking up to a fifth a day.  His wife noticed he was yellow and panicked, which led to a discussion about his drinking.  He went to the doctor yesterday and had [place] and here we are.  Last drink was Sunday.  He had been tapering over a 3-week period prior to Sunday, drank little that day.  The jaundice has actually increased as the drinking has subsided.  No h/o issues with withdrawal, but did get seen about 10 years ago for this and was given a banana bag in the ER.  He did have a significant tremor last week - his wife thought he was developing Parkinson's since she didn't realize that he had been drinking or that he had been tapering off and was likely withdrawing.    ED Course:  Hyperbilirubinemia - bili 13.  Secretly drinking heavily.  RUQ pain, nausea 12/25.  Tapered himself off ETOH 4 days ago.  [place] with  gallstones, sludge, normal bile duct.  Surgery and GI ([place]) to see.  Review of Systems: As per HPI; otherwise review of systems reviewed and negative.   Ambulatory Status:  Ambulates without assistance  COVID Vaccine Status:   Complete, no booster  Past Medical History:  Diagnosis Date  . Alcohol dependence (HCC)   . Hypertension     Past Surgical History:  Procedure Laterality Date  . ORTHOPEDIC SURGERY      Social History   Socioeconomic History  . Marital status: Married    Spouse name: Not on file  . Number of children: Not on file  . Years of education: Not on file  . Highest education level: Not on file  Occupational History  . Not on file  Tobacco Use  . Smoking status: Never Smoker  . Smokeless tobacco: Former Neurosurgeon    Types: Snuff  . Tobacco comment: uses nicotine lozenges  Substance and Sexual Activity  . Alcohol use: Yes  . Drug use: Not Currently  . Sexual activity: Not on file  Other Topics Concern  . Not on file  Social History Narrative  . Not on file   Social Determinants of Health   Financial Resource Strain: Not on file  Food Insecurity: Not on file  Transportation Needs: Not on file  Physical  Activity: Not on file  Stress: Not on file  Social Connections: Not on file  Intimate Partner Violence: Not on file    No Known Allergies  Family History  Problem Relation Age of Onset  . COPD Mother   . Bladder Cancer Father   . Esophageal cancer Maternal Grandmother     Prior to Admission medications   Medication Sig Start Date End Date Taking? Authorizing Provider  amLODipine (NORVASC) 10 MG tablet Take 10 mg by mouth daily. 08/20/20  Yes [provider]    Physical Exam: Vitals:   08/21/20 0647 08/21/20 0921 08/21/20 1022 08/21/20 1027  BP: 104/74 115/79 117/78 117/78  Pulse: 74 71 67 65  Resp: 18 20 14 11   Temp: 98.9 F (37.2 C) 98.2 F (36.8 C)  98.6 F (37 C)  TempSrc: Oral Oral  Oral  SpO2: 96% 96% 95% 97%   Weight:      Height:         . General:  Appears calm and comfortable and is in NAD; +jaundice . Eyes:  PERRL, EOMI, normal lids, iris, +scleral icterus . ENT:  grossly normal hearing, lips & tongue, mmm; appropriate dentition . Neck:  no LAD, masses or thyromegaly . Cardiovascular:  RRR, no m/r/g. No LE edema.  Respiratory:   CTA bilaterally with no wheezes/rales/rhonchi.  Normal respiratory effort. . Abdomen:  Soft, marked RUQ TTP, ND, NABS . Skin:  no rash or induration seen on limited exam; +jaundice of upper body . Musculoskeletal:  grossly normal tone BUE/BLE, good ROM, no bony abnormality . Psychiatric:  blunted mood and affect, speech fluent and appropriate, AOx3 . Neurologic:  CN 2-12 grossly intact, moves all extremities in coordinated fashion    Radiological Exams on Admission: Independently reviewed - see discussion in A/P where applicable  Marland Kitchen Abdomen Limited RUQ (LIVER/GB)  Result Date: 08/20/2020 CLINICAL DATA:  Right upper quadrant abdominal pain. EXAM: ULTRASOUND ABDOMEN LIMITED RIGHT UPPER QUADRANT COMPARISON:  None. FINDINGS: Gallbladder: There is diffuse gallbladder wall thickening with areas of the gallbladder wall measuring up to approximately 7 mm in thickness. There is pericholecystic free fluid. There is sludge with multiple small gallstones. The sonographic 10/18/2020 sign is negative. Common bile duct: Diameter: 4 mm. Liver: Diffuse increased echogenicity with slightly heterogeneous liver. Appearance typically secondary to fatty infiltration. Fibrosis secondary consideration. No secondary findings of cirrhosis noted. No focal hepatic lesion or intrahepatic biliary duct dilatation. Portal vein is patent on color Doppler imaging with normal direction of blood flow towards the liver. Other: None. IMPRESSION: 1. Gallbladder wall thickening pericholecystic free fluid with gallbladder sludge and small stones is suspicious for acute cholecystitis in the appropriate clinical  setting, however the sonographic Eulah Pont sign is reported as negative. If there is high clinical suspicion for acute cholecystitis, follow-up with HIDA scan is recommended. 2. Hepatomegaly with findings suggestive of hepatic steatosis. These results will be called to the ordering clinician or representative by the Radiologist Assistant, and communication documented in the PACS or Eulah Pont. Electronically Signed   By: [employer] M.D.   On: 08/20/2020 17:12    EKG: not done   Labs on Admission: I have personally reviewed the available labs and imaging studies at the time of the admission.  Pertinent labs:   Na++ 131 AP 229 Albumin 2.6 AST 124/ALT 45/Bili 13.0 Unremarkable CBC UA: moderate bili, 30 protein   Assessment/Plan Principal Problem:   Hyperbilirubinemia Active Problems:   Acute cholecystitis   Alcohol dependence syndrome (HCC)  Tobacco dependence   Essential hypertension   Jaundice, likely due to gallstones -Patient with 1-year history of intermittent RUQ pain with eating, worse in the last few weeks -Developed severe pain and jaundice with worst symptoms on Tuesday and mild improvement since -Reports bili in his doctor's office Wednesday was 36, improved to 13 last night; will recheck CMP and CBC now -RUQ US shows wall thickening, sludge, and small stones suspicious for acute cholecystitis -Suspect that patient passed a gallstone and the duct has normalized -Will order MRCP -NPO for now -Morphine for pain, Zofran for nausea -Surgery and GI consulted for probable cholecystectomy, possible ERCP -Empiric coverage with Rocephin for now  ETOH dependence -Patient with chronic ETOH dependence -Number of drinks per day: 1/5 liquor -Number of admissions for management of alcohol withdrawal syndrome (detox): 0, 1 ER visit -History of withdrawal seizures, ICU admissions, DTs: - -Patient is not exhibiting active s/sx of withdrawal, likely went through  withdrawal last week -Last drink was 4 days ago -CIWA protocol -Folate, thiamine, and MVI ordered -Will provide symptom-triggered BZD (ativan per CIWA protocol) only since the patient is able to communicate; is not showing current signs of delirium; and has no history of severe withdrawal. -TOC team consult for substance abuse counseling -Will also check UDS. -His situation is complicated by his apparent cholecystitis and jaundice, but currently lower suspicion that that is related to cirrhosis; he does have fatty liver on [place] and is at risk for developing jaundice -Needs lifelong cessation -Consider offering a medication for Alcohol Use Disorder at the time of d/c, to include Disulfuram; Naltrexone; or Acamprosate.  HTN -Continue Norvasc  Tobacco dependence -He stopped using oral tobacco 12 years ago ,but continues to use nicotine lozenges    Note: This patient has been tested and is pending for the novel coronavirus COVID-19. The patient has been fully vaccinated against COVID-19.  He may benefit from receiving his booster prior to d/c.    DVT prophylaxis:  SCDs Code Status:  Full - confirmed with patient/family Family Communication:  Wife was present throughout evaluation Disposition Plan:  The patient is from: home  Anticipated d/c is to: home without [facility] services   Anticipated d/c date will depend on clinical response to treatment, but likely 2-3 days Patient is currently: acutely ill Consults called: GI; surgery; TOC team Admission status:  Admit - It is my clinical opinion that admission to [place] is reasonable and necessary because of the expectation that this patient will require hospital care that crosses at least 2 midnights to treat this condition based on the medical complexity of the problems presented.  Given the aforementioned information, the predictability of an adverse outcome is felt to be significant.    Karmen Bongo MD [facility]   How to  contact the [facility] Attending or Consulting provider [place] or covering provider during after hours [place], for this patient?  1. Check the care team in [facility] and look for a) attending/consulting [facility] provider listed and b) the [facility] team listed 2. Log into [email or web address] and use [place]'s universal password to access. If you do not have the password, please contact the hospital operator. 3. Locate the [facility] provider you are looking for under [facility] and page to a number that you can be directly reached. 4. If you still have difficulty reaching the provider, please page the [facility] (Director on Call) for the Hospitalists listed on amion for assistance.   08/21/2020, 11:37 AM

## 2020-08-21 NOTE — Consult Note (Addendum)
[facility] Surgery Consult Note  Paul [name] [birth year]  [id number].    Requesting MD: Lennice Sites Chief Complaint/Reason for Consult: elevated bilirubin  HPI:  Paul [name] is a 55yo male PMH HTN and alcohol dependence (admits to heavy daily alcohol use x1 year, was drinking 1/5 liquor daily until recently he had tapered use down and last drank 4 days ago) who presented to [facility] last night complaining of abdominal pain and jaundice. Patient was seen by his PCP yesterday who sent him to the ED for abnormal labs. States that his symptoms started 12/27 with abdominal pain, nausea, and decreased appetite. Pain is mostly in the RUQ. It is now more frequent x2 days, and worse with PO intake. About 4 days ago he noticed that his skin and eyes turned yellow. Denies fever, chills, emesis, constipation, diarrhea. He has had similar pain in the past with intermittent RUQ pain for about a year, a twinge maybe once every 3 weeks, but it has never been this severe. His PCP ordered an u/s which shows gallbladder wall thickening pericholecystic free fluid with gallbladder sludge and small stones suspicious for acute cholecystitis; sonographic Murphy sign negative; common bile duct 82m; hepatomegaly with findings suggestive of hepatic steatosis. Yesterday his labwork was significant for WBC 7.7, Na 131, AST 124, ALT 45, Alk phos 229, Tbili 13, lipase 30. Repeat labs today are pending. Patient is being admitted to the medical service. GI consult pending. General surgery also asked to see in consultation.   Abdominal surgical history: none Anticoagulants: none Nonsmoker  Review of Systems  Constitutional: Negative.   Eyes:       Scleral icterus  Respiratory: Negative.   Cardiovascular: Negative.   Gastrointestinal: Positive for abdominal pain and nausea. Negative for vomiting.  Skin:       jaundice   ll systems reviewed and otherwise negative except for as above  No family history on  file.  Past Medical History:  Diagnosis Date  . Hypertension     History reviewed. No pertinent surgical history.  Social History:  reports that he has never smoked. He has never used smokeless tobacco. He reports current drug use. No history on file for alcohol use.  Allergies: No Known Allergies  (Not in a hospital admission)   Prior to Admission medications   Medication Sig Start Date End Date Taking? Authorizing Provider  amLODipine (NORVASC) 10 MG tablet Take 10 mg by mouth daily. 08/20/20  Yes [provider]    Blood pressure 117/78, pulse 65, temperature 98.6 F (37 C), temperature source Oral, resp. rate 11, height _0  (1.778 m), weight 77.1 kg, SpO2 97 %. Physical Exam: General: pleasant, WD/WN male who is laying in bed in NAD HEENT: head is normocephalic, atraumatic.  +Scleral icterus bilaterally.  PERRL.  Ears and nose without any masses or lesions.  Mouth is pink and moist. Dentition fair Heart: regular, rate, and rhythm.  Normal s1,s2. No obvious murmurs, gallops, or rubs noted.  Palpable pedal pulses bilaterally  Lungs: CTAB, no wheezes, rhonchi, or rales noted.  Respiratory effort nonlabored Abd: soft, ND, mild RUQ TTP without rebound or guarding, +BS, no masses, hernias, or organomegaly. MS: +1 BLE edema, calves soft and nontender Skin: jaundice. warm and dry with no masses, lesions, or rashes Psych: A&Ox4 with an appropriate affect Neuro: cranial nerves grossly intact, equal strength in BUE/BLE bilaterally, normal speech, thought process intact  Results for orders placed or performed during the hospital encounter of 08/20/20 (from the past 48  hour(s))  Urinalysis, Routine w reflex microscopic     Status: Abnormal   Collection Time: 08/20/20  7:51 PM  Result Value Ref Range   Color, Urine AMBER (A) YELLOW    Comment: BIOCHEMICALS MAY BE AFFECTED BY COLOR   APPearance HAZY (A) CLEAR   Specific Gravity, Urine 1.024 1.005 - 1.030   pH 5.0 5.0 - 8.0    Glucose, UA NEGATIVE NEGATIVE mg/dL   Hgb urine dipstick NEGATIVE NEGATIVE   Bilirubin Urine MODERATE (A) NEGATIVE   Ketones, ur NEGATIVE NEGATIVE mg/dL   Protein, ur 30 (A) NEGATIVE mg/dL   Nitrite NEGATIVE NEGATIVE   Leukocytes,Ua NEGATIVE NEGATIVE   RBC / HPF 0-5 0 - 5 RBC/hpf   WBC, UA 11-20 0 - 5 WBC/hpf   Bacteria, UA RARE (A) NONE SEEN   Mucus PRESENT    Amorphous Crystal PRESENT    Non Squamous Epithelial 6-10 (A) NONE SEEN    Comment: Performed at [facility], [address], [place], [place] [address]  Lipase, blood     Status: None   Collection Time: 08/20/20  8:05 PM  Result Value Ref Range   Lipase 30 11 - 51 U/L    Comment: Performed at [facility], [place] [address] [place], [place] [address]  Comprehensive metabolic panel     Status: Abnormal   Collection Time: 08/20/20  8:05 PM  Result Value Ref Range   Sodium 131 (L) 135 - 145 mmol/L   Potassium 4.0 3.5 - 5.1 mmol/L   Chloride 94 (L) 98 - 111 mmol/L   CO2 26 22 - 32 mmol/L   Glucose, Bld 94 70 - 99 mg/dL    Comment: Glucose reference range applies only to samples taken after fasting for at least 8 hours.   BUN 9 6 - 20 mg/dL   Creatinine, Ser 0.87 0.61 - 1.24 mg/dL   Calcium 8.6 (L) 8.9 - 10.3 mg/dL   Total Protein 7.3 6.5 - 8.1 g/dL   Albumin 2.6 (L) 3.5 - 5.0 g/dL   AST 124 (H) 15 - 41 U/L   ALT 45 (H) 0 - 44 U/L   Alkaline Phosphatase 229 (H) 38 - 126 U/L   Total Bilirubin 13.0 (H) 0.3 - 1.2 mg/dL   GFR, Estimated >60 >60 mL/min    Comment: (NOTE) Calculated using the CKD-EPI Creatinine Equation (2021)    Anion gap 11 5 - 15    Comment: Performed at [facility], [place] [address], [place], [place] [address]  CBC     Status: Abnormal   Collection Time: 08/20/20  8:05 PM  Result Value Ref Range   WBC 7.7 4.0 - 10.5 K/uL   RBC 4.62 4.22 - 5.81 MIL/uL   Hemoglobin 14.9 13.0 - 17.0 g/dL   HCT 41.4 39.0 - 52.0 %   MCV 89.6 80.0 - 100.0 fL   MCH 32.3 26.0 - 34.0 pg   MCHC 36.0 30.0 -  36.0 g/dL   RDW 17.2 (H) 11.5 - 15.5 %   Platelets 279 150 - 400 K/uL   nRBC 0.0 0.0 - 0.2 %    Comment: Performed at [place] [address], [place], [place] [address]   US Abdomen Limited RUQ (LIVER/GB)  Result Date: 08/20/2020 CLINICAL DATA:  Right upper quadrant abdominal pain. EXAM: ULTRASOUND ABDOMEN LIMITED RIGHT UPPER QUADRANT COMPARISON:  None. FINDINGS: Gallbladder: There is diffuse gallbladder wall thickening with areas of the gallbladder wall measuring up  to approximately 7 mm in thickness. There is pericholecystic free fluid. There is sludge with multiple small gallstones. The sonographic Percell Miller sign is negative. Common bile duct: Diameter: 4 mm. Liver: Diffuse increased echogenicity with slightly heterogeneous liver. Appearance typically secondary to fatty infiltration. Fibrosis secondary consideration. No secondary findings of cirrhosis noted. No focal hepatic lesion or intrahepatic biliary duct dilatation. Portal vein is patent on color Doppler imaging with normal direction of blood flow towards the liver. Other: None. IMPRESSION: 1. Gallbladder wall thickening pericholecystic free fluid with gallbladder sludge and small stones is suspicious for acute cholecystitis in the appropriate clinical setting, however the sonographic Percell Miller sign is reported as negative. If there is high clinical suspicion for acute cholecystitis, follow-up with HIDA scan is recommended. 2. Hepatomegaly with findings suggestive of hepatic steatosis. These results will be called to the ordering clinician or representative by the Radiologist Assistant, and communication documented in the PACS or [employer]. Electronically Signed   By: Constance Holster M.D.   On: 08/20/2020 17:12   Anti-infectives (From admission, onward)   Start     Dose/Rate Route Frequency Ordered Stop   08/21/20 1200  cefTRIAXone (ROCEPHIN) 2 g in sodium chloride 0.9 % 100 mL IVPB        2 g 200 mL/hr over 30 Minutes  Intravenous Every 24 hours 08/21/20 1124         Assessment/Plan HTN Alcohol dependence - admits to heavy daily alcohol use x1 year, was drinking 1/5 liquor daily until recently he had tapered use down and last drank 4 days ago Hyponatremia  RUQ pain Hyperbilirubinemia Jaundice - u/s shows gallbladder wall thickening pericholecystic free fluid with gallbladder sludge and small stones suspicious for acute cholecystitis; sonographic Murphy sign negative; common bile duct 44m; hepatomegaly with findings suggestive of hepatic steatosis - WBC 7.7, afebrile - elevated LFTs with AST 124, ALT 45, Alk phos 229, Tbili 13 - INR and hepatitis panel pending - MRCP pending to evaluate for choledocholithiasis  ID - rocephin 1/6>> VTE - SCDs, ok for chemical DVT prophylaxis from surgical standpoint FEN - IVF, NPO Foley - none Follow up - TBD  Plan - Agree with medical admission. GI consult pending. Will start IV rocephin for possible cholecystitis. Hyperbilirubinemia work up per GI and as above. We will follow.   MRCP:cirrhotic changes involving the liver, hepatic fibrosis, and probable diffuse regenerating nodules.  Portal venous hypertension/portal venous collaterals with paraesophageal varices.  Hepatosplenomegaly, abnormal gallbladder, with wall thickening/pericholecystic fluid; possibly due to cirrhosis/low albumin.  No definite gallstones, normal caliber CBD.  BWellington Hampshire [facility] 08/21/2020, 10:32 AM Please see Amion for pager number during day hours 7:00am-4:30pm

## 2020-08-22 LAB — COMPREHENSIVE METABOLIC PANEL
ALT: 39 U/L (ref 0–44)
AST: 111 U/L — ABNORMAL HIGH (ref 15–41)
Albumin: 2.2 g/dL — ABNORMAL LOW (ref 3.5–5.0)
Alkaline Phosphatase: 179 U/L — ABNORMAL HIGH (ref 38–126)
Anion gap: 10 (ref 5–15)
BUN: 6 mg/dL (ref 6–20)
CO2: 23 mmol/L (ref 22–32)
Calcium: 8.2 mg/dL — ABNORMAL LOW (ref 8.9–10.3)
Chloride: 98 mmol/L (ref 98–111)
Creatinine, Ser: 0.74 mg/dL (ref 0.61–1.24)
GFR, Estimated: >60 mL/min (ref >60)
Glucose, Bld: 83 mg/dL (ref 70–99)
Potassium: 3.7 mmol/L (ref 3.5–5.1)
Sodium: 131 mmol/L — ABNORMAL LOW (ref 135–145)
Total Bilirubin: 12.6 mg/dL — ABNORMAL HIGH (ref 0.3–1.2)
Total Protein: 6.1 g/dL — ABNORMAL LOW (ref 6.5–8.1)

## 2020-08-22 LAB — CBC
HCT: 37 % — ABNORMAL LOW (ref 39.0–52.0)
Hemoglobin: 13.2 g/dL (ref 13.0–17.0)
MCH: 31.8 pg (ref 26.0–34.0)
MCHC: 35.7 g/dL (ref 30.0–36.0)
MCV: 89.2 fL (ref 80.0–100.0)
Platelets: 268 10*3/uL (ref 150–400)
RBC: 4.15 MIL/uL — ABNORMAL LOW (ref 4.22–5.81)
RDW: 17.3 % — ABNORMAL HIGH (ref 11.5–15.5)
WBC: 7 10*3/uL (ref 4.0–10.5)
nRBC: 0 % (ref 0.0–0.2)

## 2020-08-22 NOTE — Progress Notes (Signed)
[facility] Progress Note     Subjective: CC-  Comfortable this morning. Denies abdominal pain, nausea, vomiting. WBC WBL, afebrile Tbili 12.6 MRCP negative for choledocholithiasis, no definite gallstones, notes cirrhotic changes involving the liver.  Objective: Vital signs in last 24 hours: Temp:  [98.2 F (36.8 C)-99.5 F (37.5 C)] 99.5 F (37.5 C) (01/07 0539) Pulse Rate:  [65-78] 74 (01/07 0539) Resp:  [11-20] 19 (01/06 1718) BP: (107-129)/(63-90) 123/80 (01/07 0539) SpO2:  [95 %-99 %] 97 % (01/07 0539) Weight:  [75.9 kg] 75.9 kg (01/06 1716) Last BM Date: 08/21/20  Intake/Output from previous day: 01/06 0701 - 01/07 0700 In: 1275.5 [P.O.:240; I.V.:935.5; IV Piggyback:100] Out: -  Intake/Output this shift: No intake/output data recorded.  PE: Gen:  Alert, NAD, pleasant HEENT: EOM's intact, pupils equal and round Card:  RRR Pulm:  CTAB, no W/R/R, rate and effort normal Abd: Soft, NT/ND, +BS Ext:  no BUE/BLE edema, calves soft and nontender Psych: A&Ox4  Skin: jaundice  Lab Results:  Recent Labs    08/21/20 1213 08/22/20 0506  WBC 7.1 7.0  HGB 13.8 13.2  HCT 38.2* 37.0*  PLT 268 268   BMET Recent Labs    08/21/20 1213 08/22/20 0506  NA 130* 131*  K 3.8 3.7  CL 96* 98  CO2 24 23  GLUCOSE 90 83  BUN 11 6  CREATININE 0.73 0.74  CALCIUM 8.4* 8.2*   PT/INR Recent Labs    08/21/20 1213  LABPROT 15.0  INR 1.2   CMP     Component Value Date/Time   NA 131 (L) 08/22/2020 0506   K 3.7 08/22/2020 0506   CL 98 08/22/2020 0506   CO2 23 08/22/2020 0506   GLUCOSE 83 08/22/2020 0506   BUN 6 08/22/2020 0506   CREATININE 0.74 08/22/2020 0506   CALCIUM 8.2 (L) 08/22/2020 0506   PROT 6.1 (L) 08/22/2020 0506   ALBUMIN 2.2 (L) 08/22/2020 0506   AST 111 (H) 08/22/2020 0506   ALT 39 08/22/2020 0506   ALKPHOS 179 (H) 08/22/2020 0506   BILITOT 12.6 (H) 08/22/2020 0506   GFRNONAA >60 08/22/2020 0506   Lipase     Component Value Date/Time    LIPASE 30 08/20/2020 2005       Studies/Results: MR ABDOMEN MRCP W WO CONTAST  Result Date: 08/21/2020 CLINICAL DATA:  Elevated liver function studies, hyperbilirubinemia and right upper quadrant abdominal pain. EXAM: MRI ABDOMEN WITHOUT AND WITH CONTRAST (INCLUDING MRCP) TECHNIQUE: Multiplanar multisequence MR imaging of the abdomen was performed both before and after the administration of intravenous contrast. Heavily T2-weighted images of the biliary and pancreatic ducts were obtained, and three-dimensional MRCP images were rendered by post processing. CONTRAST:  9m GADAVIST GADOBUTROL 1 MMOL/ML IV SOLN COMPARISON:  Ultrasound examination 08/20/2020 FINDINGS: Lower chest: The lung bases are grossly clear. No pleural or pericardial effusion. Hepatobiliary: Cirrhotic changes involving the liver with abnormal liver contour, dilated hepatic fissures and increased caudate to right lobe ratio. There are also changes of hepatic fibrosis and probable diffuse regenerating nodules. There is also fairly marked geographic fatty infiltration of the liver. No intrahepatic biliary dilatation. The portal and hepatic veins are patent. Evidence of portal venous hypertension and portal venous collaterals with paraesophageal varices. I do not see any definite early arterial phase enhancing lesions to suggest hepatoma or dysplastic nodules. Is an area of somewhat linear enhancement in the left hepatic lobe which is probably some type of vascular shunting. Gallbladder is abnormal. There is fairly marked  gallbladder wall thickening. There is also a small amount of pericholecystic fluid. Findings could be due to cirrhosis and low albumin. I do not see any definite gallstones. Normal caliber and course of the common bile duct. Pancreas:  No mass, inflammation or ductal dilatation. Spleen:  Borderline splenic enlargement.  No splenic lesions. Adrenals/Urinary Tract: The adrenal glands and kidneys are unremarkable. No worrisome  renal lesions or hydronephrosis. Stomach/Bowel: The stomach, duodenum, visualized small bowel and visualized colon are grossly normal. Vascular/Lymphatic: The aorta and branch vessels are patent. The major venous structures are patent. Portal venous collaterals are noted. Scattered upper abdominal lymph nodes, typical with cirrhosis. Other:  No ascites or abdominal wall hernia. Musculoskeletal: No significant bony findings. IMPRESSION: 1. Cirrhotic changes involving the liver with changes of hepatic fibrosis and probable diffuse regenerating nodules. No definite early arterial phase enhancing lesions to suggest hepatoma or dysplastic nodules. 2. Evidence of portal venous hypertension and portal venous collaterals with paraesophageal varices. 3. Hepatosplenomegaly. 4. Abnormal gallbladder wall thickening and pericholecystic fluid. Findings could be due to cirrhosis and low albumin. I do not see any definite gallstones. Normal caliber and course of the common bile duct. No common bile duct stones. Electronically Signed   By: Marijo Sanes M.D.   On: 08/21/2020 15:07   US Abdomen Limited RUQ (LIVER/GB)  Result Date: 08/20/2020 CLINICAL DATA:  Right upper quadrant abdominal pain. EXAM: ULTRASOUND ABDOMEN LIMITED RIGHT UPPER QUADRANT COMPARISON:  None. FINDINGS: Gallbladder: There is diffuse gallbladder wall thickening with areas of the gallbladder wall measuring up to approximately 7 mm in thickness. There is pericholecystic free fluid. There is sludge with multiple small gallstones. The sonographic Percell Miller sign is negative. Common bile duct: Diameter: 4 mm. Liver: Diffuse increased echogenicity with slightly heterogeneous liver. Appearance typically secondary to fatty infiltration. Fibrosis secondary consideration. No secondary findings of cirrhosis noted. No focal hepatic lesion or intrahepatic biliary duct dilatation. Portal vein is patent on color Doppler imaging with normal direction of blood flow towards the  liver. Other: None. IMPRESSION: 1. Gallbladder wall thickening pericholecystic free fluid with gallbladder sludge and small stones is suspicious for acute cholecystitis in the appropriate clinical setting, however the sonographic Percell Miller sign is reported as negative. If there is high clinical suspicion for acute cholecystitis, follow-up with HIDA scan is recommended. 2. Hepatomegaly with findings suggestive of hepatic steatosis. These results will be called to the ordering clinician or representative by the Radiologist Assistant, and communication documented in the PACS or [employer]. Electronically Signed   By: Constance Holster M.D.   On: 08/20/2020 17:12    Anti-infectives: Anti-infectives (From admission, onward)   Start     Dose/Rate Route Frequency Ordered Stop   08/21/20 1200  cefTRIAXone (ROCEPHIN) 2 g in sodium chloride 0.9 % 100 mL IVPB        2 g 200 mL/hr over 30 Minutes Intravenous Every 24 hours 08/21/20 1124         Assessment/Plan HTN Alcohol dependence - admits to heavy daily alcohol use x1 year, was drinking 1/5 liquor daily until recently he had tapered use down and last drank 4 days ago Hyponatremia  RUQ pain Hyperbilirubinemia Jaundice - u/s shows gallbladder wall thickening pericholecystic free fluid with gallbladder sludge and small stones suspicious for acute cholecystitis; sonographic Murphy sign negative; common bile duct 74m; hepatomegaly with findings suggestive of hepatic steatosis - WBC WNL, afebrile - elevated LFTs with AST 124, ALT 45, Alk phos 229, Tbili 13 - Hepatitis panel negative - MRCP:cirrhotic  changes involving the liver, hepatic fibrosis, and probable diffuse regenerating nodules.  Portal venous hypertension/portal venous collaterals with paraesophageal varices.  Hepatosplenomegaly, abnormal gallbladder, with wall thickening/pericholecystic fluid; possibly due to cirrhosis/low albumin.  No definite gallstones, normal caliber CBD  ID -  rocephin 1/6>> VTE - SCDs, ok for chemical DVT prophylaxis from surgical standpoint FEN - IVF, ok for diet Foley - none Follow up - TBD  Plan - Work up more consistent with liver primary as source of jaundice and pain. Would not recommend cholecystectomy at this time. Further work up and management per GI and primary. He does not need any antibiotics. [place] for diet.  We will sign off, please call with concerns.    LOS: 1 day    Wellington Hampshire, [facility] 08/22/2020, 7:39 AM Please see Amion for pager number during day hours 7:00am-4:30pm

## 2020-08-22 NOTE — Plan of Care (Signed)

## 2020-08-22 NOTE — Progress Notes (Signed)
DISCHARGE NOTE HOME Darrio Bade to be discharged Home per MD order. Discussed prescriptions and follow up appointments with the patient. Prescriptions given to patient; medication list explained in detail. Patient verbalized understanding.  Skin clean, dry and intact without evidence of skin break down, no evidence of skin tears noted. IV catheter discontinued intact. Site without signs and symptoms of complications. Dressing and pressure applied. Pt denies pain at the site currently. No complaints noted.  Patient free of lines, drains, and wounds.   An After Visit Summary (AVS) was printed and given to the patient. Patient escorted via wheelchair, and discharged home via private auto.  Leonia Reeves, RN, BSN

## 2020-08-22 NOTE — Progress Notes (Signed)
[facility]astroenterology Progress Note  Paul [name] 54 y.o. 03/08/1966  CC:  Alcoholic hepatitis vs. Decompensated cirrhosis   Subjective: Patient denies any abdominal pain, nausea, vomiting, changes in bowel movements, melena, or hematochezia.  States last drink was 3-4 days ago.  Denies chest pain or shortness of breath.  ROS : Review of Systems  Respiratory: Negative for cough and shortness of breath.   Cardiovascular: Negative for chest pain and palpitations.  Gastrointestinal: Negative for abdominal pain, blood in stool, constipation, diarrhea, heartburn, melena, nausea and vomiting.    Objective: Vital signs in last 24 hours: Vitals:   08/22/20 0152 08/22/20 0539  BP: 108/69 123/80  Pulse: 73 74  Resp:    Temp: 99.2 F (37.3 C) 99.5 F (37.5 C)  SpO2: 95% 97%    Physical Exam:  General:  Jaundiced, alert, oriented, cooperative, no distress  Head:  Normocephalic, without obvious abnormality, atraumatic  Eyes:  Icteric sclera, EOMs intact  Lungs:   Clear to auscultation bilaterally, respirations unlabored  Heart:  Regular rate and rhythm  Abdomen:   Soft, non-tender, non-distended, bowel sounds active all four quadrants,  no guarding or peritoneal signs  Extremities: Extremities normal, atraumatic, no  edema  Pulses: 2+ and symmetric    Lab Results: Recent Labs    08/21/20 1213 08/22/20 0506  NA 130* 131*  K 3.8 3.7  CL 96* 98  CO2 24 23  GLUCOSE 90 83  BUN 11 6  CREATININE 0.73 0.74  CALCIUM 8.4* 8.2*   Recent Labs    08/21/20 1213 08/22/20 0506  AST 119* 111*  ALT 44 39  ALKPHOS 209* 179*  BILITOT 13.0* 12.6*  PROT 7.0 6.1*  ALBUMIN 2.6* 2.2*   Recent Labs    08/21/20 1213 08/22/20 0506  WBC 7.1 7.0  NEUTROABS 4.8  --   HGB 13.8 13.2  HCT 38.2* 37.0*  MCV 90.3 89.2  PLT 268 268   Recent Labs    08/21/20 1213  LABPROT 15.0  INR 1.2    Assessment: Alcoholic hepatitis vs. Decompensated cirrhosis.  Hepatic discriminant function <32  as of 08/21/20.  MELD score 23 as of 08/22/20. -MRI 08/21/20: Cirrhotic changes involving the liver with changes of hepatic fibrosis and probable diffuse regenerating nodules. No definite early arterial phase enhancing lesions to suggest hepatoma or dysplastic nodules.  Evidence of portal venous hypertension and portal venous collaterals with paraesophageal varices.  Abnormal gallbladder wall thickening and pericholecystic fluid. Findings could be due to cirrhosis and low albumin -T. Bili 12.6/ AST 111/ ALT 39/ ALP 179, stable as compared to yesterday -Albumin 2.2 -Normal platelets 268 K/uL, normal Hgb 13.2  -INR normal (1.2) as of 08/22/19  Plan: Continue supportive care.  Continue to monitor LFTs.  2g sodium restricted diet.  Discussed with patient that he should follow this as an outpatient.  Also recommended low fat, high protein diet.  Diuretics are not indicated at this time.  Agree with surgery that cholecystectomy is not indicated.  Gallbladder likely inflamed from underlying cirrhosis and low albumin.  As an outpatient, he will require EGD for variceal screening.  Potential to complete screening colonoscopy at the same time, as he has not had a prior colonoscopy.  Long discussion with patient and patient's wife at bedside discussing importance of complete alcohol abstinence.    OK to discharge from GI standpoint.  We will arrange CBC/CMP in 1 week.  We will arrange FU in our office.  Patient given contact information in case he  needs to reach [place] sooner.  [facility] will sign off.  Please contact us if we can be of any further assistance during this hospital stay.  Edrick Kins PA-C 08/22/2020, 8:58 AM  Contact #  [phone]

## 2020-08-22 NOTE — Discharge Summary (Signed)
Physician Discharge Summary  Paul [name] NID:[id number] DOB: 07/10/1966 DOA: 08/20/2020  PCP: Pcp, No  Admit date: 08/20/2020 Discharge date: 08/22/2020  Time spent: 45 minutes  Recommendations for Outpatient Follow-up:  1. [facility] 2 weeks for hospital follow-up, needs repeat CMP in 1 week and EGD for variceal screening   Discharge Diagnoses:  Principal Problem: Alcoholic liver disease Alcoholic hepatitis versus decompensated liver cirrhosis Cirrhosis noted on imaging Portal venous hypertension   Alcohol dependence syndrome ([place])   Tobacco dependence   Essential hypertension   Discharge Condition: Stable  Diet recommendation: Low-sodium  Filed Weights   08/20/20 1947 08/21/20 1716  Weight: 77.1 kg 75.9 kg    History of present illness:  Paul [name] is a 55 y.o. male with medical history significant of HTN and ETOH dependence presenting with abdominal pain.  He noticed a yellow tinge to his face about 4 days ago.  It did not get better.  Occasional "gall bladder pain following meals" periodically.  It has been much more uncomfortable and frequent the last 3 days.  Tuesday night, his eyes were yellow and his skin was yellow.  He hadn't been eating and had been nauseated and so they stayed home NYE thinking maybe he had a stomach virus.  He has had intermittent RUQ pain for maybe a year, a twinge maybe once every 3 weeks.  This also coincided with stopping drinking after a year of excessive drinking.  He was a social drinker but cared for his dad for 3 months at the end of his life.  He has a h/o periodic ETOH abuse (last time was maybe 9 years ago), no problem in years.  In the last year he was drinking up to a fifth a day.  His wife noticed he was yellow and panicked, which led to a discussion about his drinking.  He went to the doctor yesterday and had [place] and here we are.  Last drink was Sunday.  He had been tapering over a 3-week period prior to Sunday, drank little  that day.  The jaundice has actually increased as the drinking has subsided  Hospital Course:   Alcoholic liver disease Portal venous hypertension -Admitted with progressive jaundice, some nausea and intermittent abdominal discomfort, history of heavy alcohol use for over a year, quit 4 days prior to admission -Labs noted bilirubin off 13, albumin of 2.6, alk phos of 249 AST and ALT of 124/45, hemoglobin and platelet count were normal -Initially there was a concern for obstructive jaundice this was ruled out on MRCP, MRCP was concerning for cirrhotic changes of the liver, hepatic fibrosis and probable diffuse degenerating nodules, there was evidence of portal venous hypertension with collaterals and paraesophageal varices. In addition there was evidence of hepatosplenomegaly and gallbladder wall thickening which is likely secondary to cirrhosis and low albumin, no definite evidence of gallstones -Followed by [facility] this admission, discriminate function was less than 32 hence did not meet criteria for steroids meld score was 23. -Patient quit drinking 5 days ago, did not have any active withdrawal of the hospital, bilirubin trending down slowly down to 12 today. -No acute abdominal symptoms at this time, -Patient has been counseled to completely abstain from alcohol -[facility] will follow up with him, he should have repeat labs in 1 week and EGD for variceal screening as outpatient in addition to screening colonoscopy   Consultations: [facility] General surgery Discharge Exam: Vitals:   08/22/20 0539 08/22/20 1007  BP: 123/80 118/81  Pulse:  74 82  Resp:  18  Temp: 99.5 F (37.5 C) 98.7 F (37.1 C)  SpO2: 97% 97%    General: Awake alert oriented x3, no distress HEENT: Scleral icterus Cardiovascular: S1-S2, regular rate rhythm Respiratory: Clear Abdomen: Soft, nontender, bowel sounds present Extremities: No edema Skin: Icterus present  Discharge  Instructions   Discharge Instructions    Activity as tolerated - No restrictions   Complete by: As directed    Discharge instructions   Complete by: As directed    Low sodium diet     Allergies as of 08/22/2020   No Known Allergies     Medication List    TAKE these medications   amLODipine 10 MG tablet Commonly known as: NORVASC Take 10 mg by mouth daily.      No Known Allergies  Follow-up Information    [facility]. Go in 2 week(s).   Why: need labs([facility]) in 1 week               The results of significant diagnostics from this hospitalization (including imaging, microbiology, ancillary and laboratory) are listed below for reference.    Significant Diagnostic Studies: MR ABDOMEN MRCP W WO CONTAST  Result Date: 08/21/2020 CLINICAL DATA:  Elevated liver function studies, hyperbilirubinemia and right upper quadrant abdominal pain. EXAM: MRI ABDOMEN WITHOUT AND WITH CONTRAST (INCLUDING MRCP) TECHNIQUE: Multiplanar multisequence MR imaging of the abdomen was performed both before and after the administration of intravenous contrast. Heavily T2-weighted images of the biliary and pancreatic ducts were obtained, and three-dimensional MRCP images were rendered by post processing. CONTRAST:  57m GADAVIST GADOBUTROL 1 MMOL/ML IV SOLN COMPARISON:  Ultrasound examination 08/20/2020 FINDINGS: Lower chest: The lung bases are grossly clear. No pleural or pericardial effusion. Hepatobiliary: Cirrhotic changes involving the liver with abnormal liver contour, dilated hepatic fissures and increased caudate to right lobe ratio. There are also changes of hepatic fibrosis and probable diffuse regenerating nodules. There is also fairly marked geographic fatty infiltration of the liver. No intrahepatic biliary dilatation. The portal and hepatic veins are patent. Evidence of portal venous hypertension and portal venous collaterals with paraesophageal varices. I do not see any definite early arterial phase  enhancing lesions to suggest hepatoma or dysplastic nodules. Is an area of somewhat linear enhancement in the left hepatic lobe which is probably some type of vascular shunting. Gallbladder is abnormal. There is fairly marked gallbladder wall thickening. There is also a small amount of pericholecystic fluid. Findings could be due to cirrhosis and low albumin. I do not see any definite gallstones. Normal caliber and course of the common bile duct. Pancreas:  No mass, inflammation or ductal dilatation. Spleen:  Borderline splenic enlargement.  No splenic lesions. Adrenals/Urinary Tract: The adrenal glands and kidneys are unremarkable. No worrisome renal lesions or hydronephrosis. Stomach/Bowel: The stomach, duodenum, visualized small bowel and visualized colon are grossly normal. Vascular/Lymphatic: The aorta and branch vessels are patent. The major venous structures are patent. Portal venous collaterals are noted. Scattered upper abdominal lymph nodes, typical with cirrhosis. Other:  No ascites or abdominal wall hernia. Musculoskeletal: No significant bony findings. IMPRESSION: 1. Cirrhotic changes involving the liver with changes of hepatic fibrosis and probable diffuse regenerating nodules. No definite early arterial phase enhancing lesions to suggest hepatoma or dysplastic nodules. 2. Evidence of portal venous hypertension and portal venous collaterals with paraesophageal varices. 3. Hepatosplenomegaly. 4. Abnormal gallbladder wall thickening and pericholecystic fluid. Findings could be due to cirrhosis and low albumin. I do not see any  definite gallstones. Normal caliber and course of the common bile duct. No common bile duct stones. Electronically Signed   By: Marijo Sanes M.D.   On: 08/21/2020 15:07   US Abdomen Limited RUQ (LIVER/GB)  Result Date: 08/20/2020 CLINICAL DATA:  Right upper quadrant abdominal pain. EXAM: ULTRASOUND ABDOMEN LIMITED RIGHT UPPER QUADRANT COMPARISON:  None. FINDINGS: Gallbladder:  There is diffuse gallbladder wall thickening with areas of the gallbladder wall measuring up to approximately 7 mm in thickness. There is pericholecystic free fluid. There is sludge with multiple small gallstones. The sonographic Percell Miller sign is negative. Common bile duct: Diameter: 4 mm. Liver: Diffuse increased echogenicity with slightly heterogeneous liver. Appearance typically secondary to fatty infiltration. Fibrosis secondary consideration. No secondary findings of cirrhosis noted. No focal hepatic lesion or intrahepatic biliary duct dilatation. Portal vein is patent on color Doppler imaging with normal direction of blood flow towards the liver. Other: None. IMPRESSION: 1. Gallbladder wall thickening pericholecystic free fluid with gallbladder sludge and small stones is suspicious for acute cholecystitis in the appropriate clinical setting, however the sonographic Percell Miller sign is reported as negative. If there is high clinical suspicion for acute cholecystitis, follow-up with HIDA scan is recommended. 2. Hepatomegaly with findings suggestive of hepatic steatosis. These results will be called to the ordering clinician or representative by the Radiologist Assistant, and communication documented in the PACS or [employer]. Electronically Signed   By: Constance Holster M.D.   On: 08/20/2020 17:12    Microbiology: Recent Results (from the past 240 hour(s))  Resp Panel by RT-PCR (Flu A&B, Covid) Nasopharyngeal Swab     Status: None   Collection Time: 08/21/20 12:13 PM   Specimen: Nasopharyngeal Swab; Nasopharyngeal(NP) swabs in vial transport medium  Result Value Ref Range Status   SARS Coronavirus 2 by RT PCR NEGATIVE NEGATIVE Final    Comment: (NOTE) SARS-CoV-2 target nucleic acids are NOT DETECTED.  The SARS-CoV-2 RNA is generally detectable in upper respiratory specimens during the acute phase of infection. The lowest concentration of SARS-CoV-2 viral copies this assay can detect is 138  copies/mL. A negative result does not preclude SARS-Cov-2 infection and should not be used as the sole basis for treatment or other patient management decisions. A negative result may occur with  improper specimen collection/handling, submission of specimen other than nasopharyngeal swab, presence of viral mutation(s) within the areas targeted by this assay, and inadequate number of viral copies(<138 copies/mL). A negative result must be combined with clinical observations, patient history, and epidemiological information. The expected result is Negative.  Fact Sheet for Patients:  EntrepreneurPulse.com.au  Fact Sheet for Healthcare Providers:  IncredibleEmployment.be  This test is no t yet approved or cleared by the [place] FDA and  has been authorized for detection and/or diagnosis of SARS-CoV-2 by FDA under an Emergency Use Authorization (EUA). This EUA will remain  in effect (meaning this test can be used) for the duration of the COVID-19 declaration under Section 564(b)(1) of the Act, 21 U.S.C.section 360bbb-3(b)(1), unless the authorization is terminated  or revoked sooner.       Influenza A by PCR NEGATIVE NEGATIVE Final   Influenza B by PCR NEGATIVE NEGATIVE Final    Comment: (NOTE) The Xpert Xpress SARS-CoV-2/FLU/RSV plus assay is intended as an aid in the diagnosis of influenza from Nasopharyngeal swab specimens and should not be used as a sole basis for treatment. Nasal washings and aspirates are unacceptable for Xpert Xpress SARS-CoV-2/FLU/RSV testing.  Fact Sheet for Patients: EntrepreneurPulse.com.au  Fact Sheet  for Healthcare Providers: IncredibleEmployment.be  This test is not yet approved or cleared by the [place] and has been authorized for detection and/or diagnosis of SARS-CoV-2 by FDA under an Emergency Use Authorization (EUA). This EUA will remain in effect (meaning  this test can be used) for the duration of the COVID-19 declaration under Section 564(b)(1) of the Act, 21 U.S.C. section 360bbb-3(b)(1), unless the authorization is terminated or revoked.  Performed at [facility], [place] [address] [place], [place] [address]      Labs: Basic Metabolic Panel: Recent Labs  Lab 08/20/20 2005 08/21/20 1213 08/22/20 0506  NA 131* 130* 131*  K 4.0 3.8 3.7  CL 94* 96* 98  CO2 26 24 23   GLUCOSE 94 90 83  BUN 9 11 6   CREATININE 0.87 0.73 0.74  CALCIUM 8.6* 8.4* 8.2*   Liver Function Tests: Recent Labs  Lab 08/20/20 2005 08/21/20 1213 08/22/20 0506  AST 124* 119* 111*  ALT 45* 44 39  ALKPHOS 229* 209* 179*  BILITOT 13.0* 13.0* 12.6*  PROT 7.3 7.0 6.1*  ALBUMIN 2.6* 2.6* 2.2*   Recent Labs  Lab 08/20/20 2005  LIPASE 30   No results for input(s): AMMONIA in the last 168 hours. CBC: Recent Labs  Lab 08/20/20 2005 08/21/20 1213 08/22/20 0506  WBC 7.7 7.1 7.0  NEUTROABS  --  4.8  --   HGB 14.9 13.8 13.2  HCT 41.4 38.2* 37.0*  MCV 89.6 90.3 89.2  PLT 279 268 268   Cardiac Enzymes: No results for input(s): CKTOTAL, CKMB, CKMBINDEX, TROPONINI in the last 168 hours. BNP: BNP (last 3 results) No results for input(s): BNP in the last 8760 hours.  ProBNP (last 3 results) No results for input(s): PROBNP in the last 8760 hours.  CBG: No results for input(s): GLUCAP in the last 168 hours.   Signed:  Domenic Polite MD.  [facility] 08/22/2020, 10:45 AM

## 2020-08-28 DIAGNOSIS — R6881 Early satiety: Secondary | ICD-10-CM | POA: Diagnosis not present

## 2020-08-28 DIAGNOSIS — K7030 Alcoholic cirrhosis of liver without ascites: Secondary | ICD-10-CM | POA: Diagnosis not present

## 2020-08-28 DIAGNOSIS — K7290 Hepatic failure, unspecified without coma: Secondary | ICD-10-CM | POA: Diagnosis not present

## 2020-08-28 DIAGNOSIS — K766 Portal hypertension: Secondary | ICD-10-CM | POA: Diagnosis not present

## 2020-09-11 DIAGNOSIS — K7010 Alcoholic hepatitis without ascites: Secondary | ICD-10-CM | POA: Diagnosis not present

## 2020-09-16 DIAGNOSIS — K7290 Hepatic failure, unspecified without coma: Secondary | ICD-10-CM | POA: Diagnosis not present

## 2020-09-16 DIAGNOSIS — K7010 Alcoholic hepatitis without ascites: Secondary | ICD-10-CM | POA: Diagnosis not present

## 2020-09-16 DIAGNOSIS — K7030 Alcoholic cirrhosis of liver without ascites: Secondary | ICD-10-CM | POA: Diagnosis not present

## 2020-09-16 DIAGNOSIS — I8510 Secondary esophageal varices without bleeding: Secondary | ICD-10-CM | POA: Diagnosis not present

## 2020-09-22 ENCOUNTER — Ambulatory Visit (INDEPENDENT_AMBULATORY_CARE_PROVIDER_SITE_OTHER): Payer: BC Managed Care – PPO | Admitting: Addiction (Substance Use Disorder)

## 2020-09-22 ENCOUNTER — Other Ambulatory Visit: Payer: Self-pay

## 2020-09-22 DIAGNOSIS — F1021 Alcohol dependence, in remission: Secondary | ICD-10-CM | POA: Diagnosis not present

## 2020-09-22 NOTE — Progress Notes (Signed)
[facility] Counselor Initial Adult Exam  Name: Paul [name] Date: 09/22/2020 MRN: [id number] DOB: [birth year] PCP: Tally Joe, MD  Time spent:  Reason for Visit Paul [name] Problem: Client came in reporting a need for txt and support for his SUD in addition to processing emotions that triggered his drinking episode this time. Client reports 2 big drinking binge episodes of over a year in his lifetime.  Client reports that he quit drinking this time over a month ago following high liver enzymes and possible new dx of sorosis of the liver. Client was given an ultimatim of drink and die or get support/txt for his SUD and stop drinking. Client reports support of his wife and 2 grown adult sons and current boss. Client thankful at "god's additional chance at life, but struggling with being mad at God for letting him suffer so much/experience so much loss that it led to this addiction". Client experiencing triggers, cravings, depression, grief, guilt, anxiety, shame, regret, and more. Client hopeful that his wife will continue forgiving him and seek txt herself because his new-found addiction this time around triggered her past traumas and have made her feel like he is "doing this to her and causing her to suffer". Client and therapist built rapport and disucssed his goals and txt strategies that he may benefit from. Therapist encouraged client to immediately engage in a 12 step meeting of some sort and to seek out a mentor/sponsor for daily support/ accountability. Therapist also discussed with client non pharmaceutical options to help with his anxiety & depression due to his liver not being able to handle more meds at this time. Client participated in the treatment planning of their therapy and made plans for his txt. Client agreed with the plan if there is a crisis: contact after hours office line, call 9-1-1 and/or crisis line given by therapist. Therapist normalized client's experience and provided  empathy for client's suffering. Therapist remained attuned to client's regulation in session and worked to demonstrate a regulated nervous system and provide support as client processed pain/suffering/ negative somatic sensations/emotions/ thoughts/ memories. Therapist also worked to help client ground their body if flooded/ disassociating and not feeling safe using focused mindfulness. Therapist inquired further with clients safety, sobriety, and stability. Client denied SI/HI/AVH, but reported triggers and a lack of resilience around not giving into cravings during high stress times in his life. Client currently reporting minimal cravings but nervousness around returning to drinking bec of his hx and also the possibility it could kill him. Client reports no desire to die, but in fact a desire to work towards sobriety and a liver transplant. Therapist used Relapse Prevention therapy & discussed with client their symptoms, concerns, and discussed her  recommendation for a higher level of care if need be for keeping him sober as his liver heals. Therapist encouraged client to seek out additional support such as IOP/ detox/ 12 step meeting, where client could have additional support more around the clock/ throughout the week. Therapist encouraged client to be thinking about goals client is not ambivalent on meeting that include "not picking up a drink/ not running to immediate gratification".  Finally, therapist affirmed client's bravery in processing and also client's progress in treatment, encouraging client to continue coming to therapy to continue to  process more cravings, urges, and methods for avoiding new/continued use of substances to numb their emotions. Therapist inquired about client's ability to come weekly to txt and client agreed to begin txt and other external supports.  Mental Status Exam:   Appearance:   Well Groomed     Behavior:  Sharing  Motor:  Restlestness  Speech/Language:   Clear and  Coherent and Normal Rate  Affect:  Appropriate and Congruent  Mood:  anxious  Thought process:  circumstantial and concrete  Thought content:    Rumination  Sensory/Perceptual disturbances:    Flashback  Orientation:  x4  Attention:  Good  Concentration:  Good  Memory:  WNL  Fund of knowledge:   Good  Insight:    Fair  Judgment:   Good  Impulse Control:  Fair   Reported Symptoms:  Depression, grief, guilt, anxiety, etc.  Risk Assessment: Danger to Self:  No Self-injurious Behavior: No Danger to Others: No Duty to Warn:no Physical Aggression / Violence:No  Access to Firearms a concern: No  Gang Involvement:No  Patient / guardian was educated about steps to take if suicide or homicide risk level increases between visits: yes While future psychiatric events cannot be accurately predicted, the patient does not currently require acute inpatient psychiatric care and does not currently meet [facility] involuntary commitment criteria.  Substance Abuse History: Current substance abuse: No   - Client quit drinking a month ago.- was drinking daily 3 quarts of liquor and a few beers.  Past Psychiatric History:   Previous psychological history is significant for depression and grief & addiction Outpatient Providers: Dr Azucena Cecil  Abuse History: Victim of No., n/a   Report needed: No. Victim of Neglect:No. Perpetrator of n/a  Witness / Exposure to Domestic Violence: No   Protective Services Involvement: No  Witness to [employer] Violence:  No   Living situation: the patient lives with their spouse  Sexual Orientation:  Straight  Relationship Status: married  Name of spouse / other: Amy wife             If a parent, number of children / ages: 2 adult sons: 23 & 74.  Support Systems; spouse friends  Surveyor, quantity Stress:  Yes   Income/Employment/Disability: Employment  Educational History: Education: Risk manager:   Protestant- not  currently connected with church.   Recreation/Hobbies: cooking & reno.  Stressors:Health problems Marital or family conflict Medication change or noncompliance Substance abuse  Strengths:  Supportive Relationships, Friends, Hopefulness, Journalist, newspaper and Able to Communicate Effectively  Legal History: Pending legal issue / charges: The patient has no significant history of legal issues.  Medical History/Surgical History:reviewed Past Medical History:  Diagnosis Date  . Alcohol dependence (HCC)   . Hypertension     Medications: Current Outpatient Medications  Medication Sig Dispense Refill  . amLODipine (NORVASC) 10 MG tablet Take 10 mg by mouth daily.     No current facility-administered medications for this visit.    Diagnoses:    ICD-10-CM   1. Alcohol use disorder, severe, in early remission, in controlled environment, dependence (HCC)  F10.21     Plan of Care: Client to return for weekly therapy with [name], therapist, for outpatient therapy, to review again in 3-6 months.  Client is to consider seeing medication provider for support of mood management, triggers and cravings following stabilization of his liver.  Client to follow through with adding more support for them in their SUD txt: engaging ina form of 12 step meetings and meeting with a sponsor/ mentor for daily support/accountability.  Client to engage in SA txt and RPT relapse prevention therapy AEB coming to therapy weekly and implementing recovery oriented coping strategies to help reduce  use of substances and to find relief from symptoms of MH disorders and trauma that cause them to want to escape from reality and numb their mind&body.  Client also to create more stability & structure AEB goal planning with therapist to assist in helping them get into a healthy rhythm/ schedule that can help to calm their nervous system and begin to build more healthy brain neuropathways.  Client to engage in CBT:  challenging negative internal ruminations and self-talk AEB expressing toxic thoughts and challenging them with truth.  Client to practice DBT distress tolerance skills (such as distress tolerance and emotion regulation) to practice achieving wise mind and to build support for dealing with cravings AEB learning to ride the wave of emotion/sensation/etc instead of seeking negative self-soothing techniques: ie using substances to numb self.  Client to utilize BSP (brainspotting) with therapist to help client identify and process triggers for their MH/ SUD with goal of reducing SUDs by 33% each session.  Client to prioritize sleep 8+ hours each week night AEB going to bed by 10pm each night.   [name], LCSW, LCAS, CCTP, CCS-I, BSP.

## 2020-09-25 DIAGNOSIS — K7030 Alcoholic cirrhosis of liver without ascites: Secondary | ICD-10-CM | POA: Diagnosis not present

## 2020-09-25 DIAGNOSIS — K7010 Alcoholic hepatitis without ascites: Secondary | ICD-10-CM | POA: Diagnosis not present

## 2020-09-25 DIAGNOSIS — K766 Portal hypertension: Secondary | ICD-10-CM | POA: Diagnosis not present

## 2020-09-25 DIAGNOSIS — K7290 Hepatic failure, unspecified without coma: Secondary | ICD-10-CM | POA: Diagnosis not present

## 2020-10-06 ENCOUNTER — Encounter: Payer: Self-pay | Admitting: Addiction (Substance Use Disorder)

## 2020-10-06 ENCOUNTER — Ambulatory Visit (INDEPENDENT_AMBULATORY_CARE_PROVIDER_SITE_OTHER): Payer: BC Managed Care – PPO | Admitting: Addiction (Substance Use Disorder)

## 2020-10-06 ENCOUNTER — Other Ambulatory Visit: Payer: Self-pay

## 2020-10-06 DIAGNOSIS — F1021 Alcohol dependence, in remission: Secondary | ICD-10-CM

## 2020-10-06 NOTE — Progress Notes (Signed)
[facility] Counselor/Therapist Progress Note  Patient ID: Paul [name], MRN: [id number],    Date: 10/06/2020  Time Spent:  Treatment Type: Individual Therapy  Reported Symptoms: fearful, saddened, guilty.   Mental Status Exam:  Appearance:   Neat     Behavior:  Appropriate  Motor:  Restlestness  Speech/Language:   Clear and Coherent and Normal Rate  Affect:  Non-Congruent  Mood:  anxious, decreased range and sad  Thought process:  circumstantial  Thought content:    Obsessions and Rumination  Sensory/Perceptual disturbances:    Flashback  Orientation:  x4  Attention:  Good  Concentration:  Fair  Memory:  WNL  Fund of knowledge:   Good  Insight:    Good  Judgment:   Fair  Impulse Control:  Fair   Risk Assessment: Danger to Self:  No  Self-injurious Behavior: No- no longer binge drinking Danger to Others: No Duty to Warn:no Physical Aggression / Violence:No  Access to Firearms a concern: No  Gang Involvement:No   Subjective: Client processed feeling fearful things would worsen with his liver and also with his wife's mental condition. Client carries a lot of guilt related to this because he feels that he is to blame for triggering's client's trauma in the past. Client discussed how he came to this conclusion and therapist used MI & CBT with client to support him while helping him to recognize any irrational thoughts that make him feel undue amounts of shame and take all weight of his wife's MH struggles. Therapist also used RPT with client to help him think of things that would support his recovery not cause additional unreasonable stressors on him to stay sober (explaining that those stressors could become his trigger to drink and to find ways to discharge that stress & guilt). Therapist used MI to help client recognize his lack of ability to carry all the weight of others' stability. Therapist assessed for stability and sobriety and client denied SI/HI/AVH and  reported no recent drinking and cravings.   Interventions: Cognitive Behavioral Therapy, Motivational Interviewing and RPT  Diagnosis:   ICD-10-CM   1. Alcohol use disorder, severe, in early remission, in controlled environment, dependence (HCC)  F10.21     Plan of Care: Client to return for weekly therapy with [name], therapist, for outpatient therapy, to review again in 3-6 months.  Client is to consider seeing medication provider for support of mood management, triggers and cravings following stabilization of his liver.  Client to follow through with adding more support for them in their SUD txt: engaging ina form of 12 step meetings and meeting with a sponsor/ mentor for daily support/accountability.  Client to engage in SA txt and RPT relapse prevention therapy AEB coming to therapy weekly and implementing recovery oriented coping strategies to help reduce use of substances and to find relief from symptoms of MH disorders and trauma that cause them to want to escape from reality and numb their mind&body.  Client also to create more stability & structure AEB goal planning with therapist to assist in helping them get into a healthy rhythm/ schedule that can help to calm their nervous system and begin to build more healthy brain neuropathways.  Client to engage in CBT: challenging negative internal ruminations and self-talk AEB expressing toxic thoughts and challenging them with truth.  Client to practice DBT distress tolerance skills (such as distress tolerance and emotion regulation) to practice achieving wise mind and to build support for dealing with  cravings AEB learning to ride the wave of emotion/sensation/etc instead of seeking negative self-soothing techniques: ie using substances to numb self.  Client to utilize BSP (brainspotting) with therapist to help client identify and process triggers for their MH/ SUD with goal of reducing SUDs by 33% each session.  Client to prioritize sleep  8+ hours each week night AEB going to bed by 10pm each night.   [name], LCSW

## 2020-10-13 ENCOUNTER — Ambulatory Visit: Payer: BC Managed Care – PPO | Admitting: Addiction (Substance Use Disorder)

## 2020-10-17 DIAGNOSIS — I8510 Secondary esophageal varices without bleeding: Secondary | ICD-10-CM | POA: Diagnosis not present

## 2020-10-17 DIAGNOSIS — K7030 Alcoholic cirrhosis of liver without ascites: Secondary | ICD-10-CM | POA: Diagnosis not present

## 2020-10-17 DIAGNOSIS — K7010 Alcoholic hepatitis without ascites: Secondary | ICD-10-CM | POA: Diagnosis not present

## 2020-10-17 DIAGNOSIS — K7290 Hepatic failure, unspecified without coma: Secondary | ICD-10-CM | POA: Diagnosis not present

## 2020-10-20 ENCOUNTER — Ambulatory Visit (INDEPENDENT_AMBULATORY_CARE_PROVIDER_SITE_OTHER): Payer: BC Managed Care – PPO | Admitting: Addiction (Substance Use Disorder)

## 2020-10-20 ENCOUNTER — Other Ambulatory Visit: Payer: Self-pay

## 2020-10-20 DIAGNOSIS — F1021 Alcohol dependence, in remission: Secondary | ICD-10-CM | POA: Diagnosis not present
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
## 2020-10-20 NOTE — Addendum Note (Signed)
Addended by: [name] on: 10/20/2020 03:27 PM   Modules accepted: Level of Service

## 2020-10-27 ENCOUNTER — Other Ambulatory Visit: Payer: Self-pay

## 2020-10-27 ENCOUNTER — Ambulatory Visit (INDEPENDENT_AMBULATORY_CARE_PROVIDER_SITE_OTHER): Payer: BC Managed Care – PPO | Admitting: Addiction (Substance Use Disorder)

## 2020-10-27 DIAGNOSIS — F1021 Alcohol dependence, in remission: Secondary | ICD-10-CM | POA: Diagnosis not present

## 2020-10-27 NOTE — Progress Notes (Signed)
[facility] Counselor/Therapist Progress Note  Patient ID: Paul [name], MRN: [id number],    Date: 10/27/2020  Time Spent:   Treatment Type: Individual Therapy  Reported Symptoms: feeling supported, in shock  Mental Status Exam:  Appearance:   Casual     Behavior:  Appropriate  Motor:  Normal  Speech/Language:   Clear and Coherent and Normal Rate  Affect:  Appropriate and Congruent  Mood:  normal  Thought process:  circumstantial  Thought content:    Obsessions and Rumination  Sensory/Perceptual disturbances:    Flashback  Orientation:  x4  Attention:  Good  Concentration:  Fair  Memory:  WNL  Fund of knowledge:   Good  Insight:    Good  Judgment:   Good  Impulse Control:  Fair   Risk Assessment: Danger to Self:  No  Self-injurious Behavior: No- no longer binge drinking Danger to Others: No Duty to Warn:no Physical Aggression / Violence:No  Access to Firearms a concern: No  Gang Involvement:No   Subjective: Client processed feeling supported by his family and friends as he supports his wife in her MH crisis. Client processed how he is still in shock that "he almost died" due to his liver disease from binge alcohol use. Client identified his willingness to change and put in the hard work and therapist used MI with client to explore his motivation and also help him create a plan for coping with his grief around his drinking (RPT) and cope with external stressors, like his wife and his wife's family stressors. Therapist assessed for stability and sobriety and client denied SI/HI/AVH and reported no recent drinking and cravings.  Interventions: Motivational Interviewing and RPT & Family Systems  Diagnosis:   ICD-10-CM   1. Alcohol use disorder, severe, in early remission, in controlled environment, dependence (HCC)  F10.21      Plan of Care: Client to return for weekly therapy with [name], therapist, for outpatient therapy, to review again in 3-6  months.  Client is to consider seeing medication provider for support of mood management, triggers and cravings following stabilization of his liver.  Client to follow through with adding more support for them in their SUD txt: engaging ina form of 12 step meetings and meeting with a sponsor/ mentor for daily support/accountability.  Client to engage in SA txt and RPT relapse prevention therapy AEB coming to therapy weekly and implementing recovery oriented coping strategies to help reduce use of substances and to find relief from symptoms of MH disorders and trauma that cause them to want to escape from reality and numb their mind&body.  Client also to create more stability & structure AEB goal planning with therapist to assist in helping them get into a healthy rhythm/ schedule that can help to calm their nervous system and begin to build more healthy brain neuropathways.  Client to engage in CBT: challenging negative internal ruminations and self-talk AEB expressing toxic thoughts and challenging them with truth.  Client to practice DBT distress tolerance skills (such as distress tolerance and emotion regulation) to practice achieving wise mind and to build support for dealing with cravings AEB learning to ride the wave of emotion/sensation/etc instead of seeking negative self-soothing techniques: ie using substances to numb self.  Client to utilize BSP (brainspotting) with therapist to help client identify and process triggers for their MH/ SUD with goal of reducing SUDs by 33% each session.  Client to prioritize sleep 8+ hours each week night AEB going to  bed by 10pm each night.   [name], LCSW, LCAS, CCTP, CCS-I, BSP

## 2020-10-30 ENCOUNTER — Encounter: Payer: BC Managed Care – PPO | Attending: Gastroenterology | Admitting: Dietician

## 2020-10-30 ENCOUNTER — Other Ambulatory Visit: Payer: Self-pay

## 2020-10-30 DIAGNOSIS — K7030 Alcoholic cirrhosis of liver without ascites: Secondary | ICD-10-CM

## 2020-10-30 NOTE — Progress Notes (Addendum)
Medical Nutrition Therapy  Appointment Start time:  1600  Appointment End time:  3893   Primary concerns today: Patient is here today alone.  He states that he should follow a high protein, low sodium, low fat diet  Referral diagnosis: alcoholic hepatitis, alcoholic cirrhosis, elevated LDL, HTN,  paraesophageal varies Preferred learning style:  no preference indicated Learning readiness:  ready   NUTRITION ASSESSMENT  Anthropometrics  5'10" 165 lbs 10/30/2020 increased from 157 lbs 09/16/2020   Clinical Medical Hx: alcoholic hepatitis, alcoholic cirrhosis, hepatic encephalopathy, HTN, elevated LDL Medications: see list to include lactulose and supplements below, just finished a prednisone taper Labs: 08/22/2020 sodium 131, alk phos 179, AST 111, ALT 39; 10/17/20:  eGFR 122, ALP 142, AST 57, ALT 90;  08/20/2020:  Cholesterol 41, HDL 5, LDL 155, Triglycerides 306 Notable Signs/Symptoms: none at this time.  States that he feels better.  Energy is good.  Lifestyle & Dietary Hx  Patient lives with his wife.  He does the shopping and cooking. 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 He is documenting his intake in [employer] He is following a low sodium, low fat diet that is high in protein and aiming for about 135 grams protein daily  Estimated daily fluid intake: no current restriction Supplements: thiamine, vitamin B-12, MVI daily Sleep: 6-7 Stress / self-care: improving Current average weekly physical activity: 20 minutes walking most days for the past week.  Has just been allowed to begin exercise by MD.  24-Hr Dietary Recall First Meal: kale smoothie, plant based protein shake, 2 pieces of fruit Snack: none Second Meal: very large salad, grilled chicken, homemade dressing Snack: 2 yogurt Third Meal: rice blend, chicken or fish, legume, green vegetable Snack: fruit, yogurt, Lara bar Beverages: water, kale smoothie, 1 plant based protein shake, 1 cup coffee  daily  Estimated Energy Needs Calories: 2400-2500 Protein: 90-110g   NUTRITION DIAGNOSIS  NB-1.1 Food and nutrition-related knowledge deficit As related to diet for cirrhosis.  As evidenced by patient report.   NUTRITION INTERVENTION  Nutrition education (E-1) on the following topics:  . Review of low sodium . Purpose and recommendation of 3 meals plus snacks daily for consistent energy and prevention of fasting state and recommended caloreis . Low fat . Protein sources plant, animal and answered questions re:  BCAA as well as recommended amounts . Recommendation to avoid alcohol  Handouts Provided Include   Cirrhosis nutrition therapy from [facility]  Low sodium nutrition therapy from [place] [facility] Teaching method utilized: Visual & Auditory  Demonstrated degree of understanding via: Teach Back  Barriers to learning/adherence to lifestyle change: none  Goals Established by Pt . Avoid all alcohol . Continue low sodium, low fat diet . Continue adequate protein . Continue 3 meals, 3 snacks daily . Slowly increase exercise as tolerated   MONITORING & EVALUATION Dietary intake, weekly physical activity, and label reading prn.  Next Steps  Patient is to call or email for questions.

## 2020-10-30 NOTE — Patient Instructions (Addendum)
Congratulation on all of your hard work and changes that you have made!    Continue a low sodium diet (<2000 mg daily) 2400-2500 calories per day 90-110 gram protein per day Continued low fat Continue 3 meals plus snacks daily Plant protein as part of your total protein.

## 2020-11-03 ENCOUNTER — Ambulatory Visit (INDEPENDENT_AMBULATORY_CARE_PROVIDER_SITE_OTHER): Payer: BC Managed Care – PPO | Admitting: Addiction (Substance Use Disorder)

## 2020-11-03 ENCOUNTER — Other Ambulatory Visit: Payer: Self-pay

## 2020-11-03 DIAGNOSIS — F4321 Adjustment disorder with depressed mood: Secondary | ICD-10-CM | POA: Diagnosis not present

## 2020-11-03 DIAGNOSIS — F1021 Alcohol dependence, in remission: Secondary | ICD-10-CM | POA: Diagnosis not present

## 2020-11-03 NOTE — Progress Notes (Signed)
[facility] Counselor/Therapist Progress Note  Patient ID: Paul [name], MRN: [id number],    Date: 11/03/2020  Time Spent:   Treatment Type: Individual Therapy  Reported Symptoms: some rumination/ little cravings.   Mental Status Exam:  Appearance:   Casual     Behavior:  Appropriate  Motor:  Normal  Speech/Language:   Clear and Coherent and Normal Rate  Affect:  Appropriate and Congruent  Mood:  normal  Thought process:  normal  Thought content:    Rumination  Sensory/Perceptual disturbances:    Flashback  Orientation:  x4  Attention:  Good  Concentration:  Fair  Memory:  WNL  Fund of knowledge:   Good  Insight:    Good  Judgment:   Good  Impulse Control:  Good   Risk Assessment: Danger to Self:  No  Self-injurious Behavior: No- no longer binge drinking Danger to Others: No Duty to Warn:no Physical Aggression / Violence:No  Access to Firearms a concern: No  Gang Involvement:No   Subjective: Client processed feeling more relief as he went back to work today and his wife stayed home today. Client expressed how he has had some rumination of drinking and little craving, but processed how he played the tape out to the bad memories. Client processed past memories of having to hide his drinking and always having to be on guard, defending places he kept hidden his liquor. Client expressed the shame he felt that ruined his sense of confidence and calm. Therapist used MI & RPT with client to support him, validate the progress he has made, and help him consider ways to continue grieving the past relationship with alcohol while also savoring the moments of "not having to hide anymore". Therapist assessed for stability and sobriety and client denied SI/HI/AVH and reported no recent drinking and cravings.  Interventions: Motivational Interviewing and RPT   Diagnosis:   ICD-10-CM   1. Alcohol use disorder, severe, in early remission, in controlled environment, dependence  (HCC)  F10.21   2. Grief reaction  F43.21      Plan of Care: Client to return for weekly therapy with [name], therapist, for outpatient therapy, to review again in 3-6 months.  Client is to consider seeing medication provider for support of mood management, triggers and cravings following stabilization of his liver.  Client to follow through with adding more support for them in their SUD txt: engaging ina form of 12 step meetings and meeting with a sponsor/ mentor for daily support/accountability.  Client to engage in SA txt and RPT relapse prevention therapy AEB coming to therapy weekly and implementing recovery oriented coping strategies to help reduce use of substances and to find relief from symptoms of MH disorders and trauma that cause them to want to escape from reality and numb their mind&body.  Client also to create more stability & structure AEB goal planning with therapist to assist in helping them get into a healthy rhythm/ schedule that can help to calm their nervous system and begin to build more healthy brain neuropathways.  Client to engage in CBT: challenging negative internal ruminations and self-talk AEB expressing toxic thoughts and challenging them with truth.  Client to practice DBT distress tolerance skills (such as distress tolerance and emotion regulation) to practice achieving wise mind and to build support for dealing with cravings AEB learning to ride the wave of emotion/sensation/etc instead of seeking negative self-soothing techniques: ie using substances to numb self.  Client to utilize BSP (brainspotting)  with therapist to help client identify and process triggers for their MH/ SUD with goal of reducing SUDs by 33% each session.  Client to prioritize sleep 8+ hours each week night AEB going to bed by 10pm each night.   [name], LCSW, LCAS, CCTP, CCS-I, BSP

## 2020-11-10 ENCOUNTER — Ambulatory Visit: Payer: BC Managed Care – PPO | Admitting: Addiction (Substance Use Disorder)

## 2020-11-11 DIAGNOSIS — K7030 Alcoholic cirrhosis of liver without ascites: Secondary | ICD-10-CM | POA: Diagnosis not present

## 2020-11-17 ENCOUNTER — Ambulatory Visit: Payer: BC Managed Care – PPO | Admitting: Addiction (Substance Use Disorder)

## 2020-12-02 DIAGNOSIS — K7030 Alcoholic cirrhosis of liver without ascites: Secondary | ICD-10-CM | POA: Diagnosis not present

## 2020-12-02 DIAGNOSIS — I10 Essential (primary) hypertension: Secondary | ICD-10-CM | POA: Diagnosis not present

## 2020-12-02 DIAGNOSIS — M2550 Pain in unspecified joint: Secondary | ICD-10-CM | POA: Diagnosis not present

## 2020-12-09 ENCOUNTER — Ambulatory Visit (INDEPENDENT_AMBULATORY_CARE_PROVIDER_SITE_OTHER): Payer: BC Managed Care – PPO | Admitting: Addiction (Substance Use Disorder)

## 2020-12-09 ENCOUNTER — Other Ambulatory Visit: Payer: Self-pay

## 2020-12-09 DIAGNOSIS — F4321 Adjustment disorder with depressed mood: Secondary | ICD-10-CM

## 2020-12-09 DIAGNOSIS — F1021 Alcohol dependence, in remission: Secondary | ICD-10-CM | POA: Diagnosis not present
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
## 2020-12-18 DIAGNOSIS — M47818 Spondylosis without myelopathy or radiculopathy, sacral and sacrococcygeal region: Secondary | ICD-10-CM | POA: Diagnosis not present

## 2020-12-18 DIAGNOSIS — M549 Dorsalgia, unspecified: Secondary | ICD-10-CM | POA: Diagnosis not present

## 2020-12-18 DIAGNOSIS — M545 Low back pain, unspecified: Secondary | ICD-10-CM | POA: Diagnosis not present

## 2020-12-18 DIAGNOSIS — K7030 Alcoholic cirrhosis of liver without ascites: Secondary | ICD-10-CM | POA: Diagnosis not present

## 2020-12-18 DIAGNOSIS — M1990 Unspecified osteoarthritis, unspecified site: Secondary | ICD-10-CM | POA: Diagnosis not present

## 2020-12-18 DIAGNOSIS — M47816 Spondylosis without myelopathy or radiculopathy, lumbar region: Secondary | ICD-10-CM | POA: Diagnosis not present

## 2020-12-18 DIAGNOSIS — M25559 Pain in unspecified hip: Secondary | ICD-10-CM | POA: Diagnosis not present

## 2020-12-18 DIAGNOSIS — M533 Sacrococcygeal disorders, not elsewhere classified: Secondary | ICD-10-CM | POA: Diagnosis not present

## 2020-12-22 ENCOUNTER — Ambulatory Visit (INDEPENDENT_AMBULATORY_CARE_PROVIDER_SITE_OTHER): Payer: BC Managed Care – PPO | Admitting: Addiction (Substance Use Disorder)

## 2020-12-22 ENCOUNTER — Other Ambulatory Visit: Payer: Self-pay

## 2020-12-22 DIAGNOSIS — F4321 Adjustment disorder with depressed mood: Secondary | ICD-10-CM

## 2020-12-22 DIAGNOSIS — F1021 Alcohol dependence, in remission: Secondary | ICD-10-CM | POA: Diagnosis not present

## 2020-12-22 NOTE — Progress Notes (Signed)
[facility] Counselor/Therapist Progress Note  Patient ID: Paul [name], MRN: [id number],    Date: 12/22/2020  Time Spent:   Treatment Type: Individual Therapy  Reported Symptoms: feeling stuck  Mental Status Exam:  Appearance:   Casual     Behavior:  Appropriate  Motor:  Normal  Speech/Language:   Clear and Coherent and Normal Rate  Affect:  Appropriate and Congruent  Mood:  anxious and decreased range  Thought process:  normal  Thought content:    Rumination  Sensory/Perceptual disturbances:    Flashback & intrusive thoughts  Orientation:  x4  Attention:  Good  Concentration:  Fair  Memory:  WNL  Fund of knowledge:   Good  Insight:    Good  Judgment:   Good  Impulse Control:  Good   Risk Assessment: Danger to Self:  No  Self-injurious Behavior: No- no longer binge drinking Danger to Others: No Duty to Warn:no Physical Aggression / Violence:No  Access to Firearms a concern: No  Gang Involvement:No   Subjective: Client processed and increase in feeling stuck and even paralyzed at times, connected to feeling helpless as he watches his wife struggle with her MH. Client feeling on edge as he watches for his wife's continual safety and attends her therapy sessions where they process her trauma. Therapist asked client about his exposure to the trauma client verbally shared and how it affects him. Client became tearful and flooded as he mentioned hearing too much. Therapist assessed for safety and client denied having SI/HI/AVH. Therapist used MI & RPT with client to support him as he shared how it affects him while also helping to encourage client to limit his exposure to the news of the traumas she endured.   Interventions: Motivational Interviewing and RPT   Diagnosis:   ICD-10-CM   1. Alcohol use disorder, severe, in early remission, in controlled environment, dependence (HCC)  F10.21   2. Grief reaction  F43.21      Plan of Care: Client to return for  weekly therapy with [name], therapist, for outpatient therapy, to review again in 3-6 months.  Client is to consider seeing medication provider for support of mood management, triggers and cravings following stabilization of his liver.  Client to follow through with adding more support for them in their SUD txt: engaging ina form of 12 step meetings and meeting with a sponsor/ mentor for daily support/accountability.  Client to engage in SA txt and RPT relapse prevention therapy AEB coming to therapy weekly and implementing recovery oriented coping strategies to help reduce use of substances and to find relief from symptoms of MH disorders and trauma that cause them to want to escape from reality and numb their mind&body.  Client also to create more stability & structure AEB goal planning with therapist to assist in helping them get into a healthy rhythm/ schedule that can help to calm their nervous system and begin to build more healthy brain neuropathways.  Client to engage in CBT: challenging negative internal ruminations and self-talk AEB expressing toxic thoughts and challenging them with truth.  Client to practice DBT distress tolerance skills (such as distress tolerance and emotion regulation) to practice achieving wise mind and to build support for dealing with cravings AEB learning to ride the wave of emotion/sensation/etc instead of seeking negative self-soothing techniques: ie using substances to numb self.  Client to utilize BSP (brainspotting) with therapist to help client identify and process triggers for their MH/ SUD with goal  of reducing SUDs by 33% each session.  Client to prioritize sleep 8+ hours each week night AEB going to bed by 10pm each night.   [name], LCSW, LCAS, CCTP, CCS-I, BSP

## 2020-12-23 DIAGNOSIS — B029 Zoster without complications: Secondary | ICD-10-CM | POA: Diagnosis not present

## 2020-12-24 DIAGNOSIS — K7030 Alcoholic cirrhosis of liver without ascites: Secondary | ICD-10-CM | POA: Diagnosis not present

## 2020-12-24 DIAGNOSIS — K7010 Alcoholic hepatitis without ascites: Secondary | ICD-10-CM | POA: Diagnosis not present

## 2020-12-24 DIAGNOSIS — I8510 Secondary esophageal varices without bleeding: Secondary | ICD-10-CM | POA: Diagnosis not present

## 2020-12-24 DIAGNOSIS — K7290 Hepatic failure, unspecified without coma: Secondary | ICD-10-CM | POA: Diagnosis not present

## 2020-12-25 ENCOUNTER — Other Ambulatory Visit: Payer: Self-pay | Admitting: Gastroenterology

## 2020-12-29 ENCOUNTER — Other Ambulatory Visit: Payer: Self-pay | Admitting: Gastroenterology

## 2020-12-29 ENCOUNTER — Ambulatory Visit: Payer: BC Managed Care – PPO | Admitting: Addiction (Substance Use Disorder)

## 2020-12-29 DIAGNOSIS — K7030 Alcoholic cirrhosis of liver without ascites: Secondary | ICD-10-CM

## 2021-01-01 DIAGNOSIS — M25559 Pain in unspecified hip: Secondary | ICD-10-CM | POA: Diagnosis not present

## 2021-01-01 DIAGNOSIS — M549 Dorsalgia, unspecified: Secondary | ICD-10-CM | POA: Diagnosis not present

## 2021-01-01 DIAGNOSIS — I10 Essential (primary) hypertension: Secondary | ICD-10-CM | POA: Diagnosis not present

## 2021-01-01 DIAGNOSIS — K7030 Alcoholic cirrhosis of liver without ascites: Secondary | ICD-10-CM | POA: Diagnosis not present

## 2021-01-20 ENCOUNTER — Other Ambulatory Visit (HOSPITAL_COMMUNITY)
Admission: RE | Admit: 2021-01-20 | Discharge: 2021-01-20 | Disposition: A | Discharge status: Home / Self Care | Payer: BC Managed Care – PPO | Source: Ambulatory Visit | Attending: Gastroenterology | Admitting: Gastroenterology

## 2021-01-20 DIAGNOSIS — Z87891 Personal history of nicotine dependence: Secondary | ICD-10-CM | POA: Diagnosis not present

## 2021-01-20 DIAGNOSIS — K644 Residual hemorrhoidal skin tags: Secondary | ICD-10-CM | POA: Diagnosis not present

## 2021-01-20 DIAGNOSIS — I10 Essential (primary) hypertension: Secondary | ICD-10-CM | POA: Diagnosis not present

## 2021-01-20 DIAGNOSIS — Z79899 Other long term (current) drug therapy: Secondary | ICD-10-CM | POA: Diagnosis not present

## 2021-01-20 DIAGNOSIS — Z1211 Encounter for screening for malignant neoplasm of colon: Secondary | ICD-10-CM | POA: Diagnosis not present

## 2021-01-20 DIAGNOSIS — Z01812 Encounter for preprocedural laboratory examination: Secondary | ICD-10-CM | POA: Insufficient documentation

## 2021-01-20 DIAGNOSIS — K5730 Diverticulosis of large intestine without perforation or abscess without bleeding: Secondary | ICD-10-CM | POA: Diagnosis not present

## 2021-01-20 DIAGNOSIS — K8020 Calculus of gallbladder without cholecystitis without obstruction: Secondary | ICD-10-CM | POA: Diagnosis not present

## 2021-01-20 DIAGNOSIS — Z20822 Contact with and (suspected) exposure to covid-19: Secondary | ICD-10-CM | POA: Insufficient documentation

## 2021-01-20 DIAGNOSIS — K2950 Unspecified chronic gastritis without bleeding: Secondary | ICD-10-CM | POA: Diagnosis not present

## 2021-01-20 DIAGNOSIS — Z800 Family history of malignant neoplasm of digestive organs: Secondary | ICD-10-CM | POA: Diagnosis not present

## 2021-01-20 DIAGNOSIS — Z825 Family history of asthma and other chronic lower respiratory diseases: Secondary | ICD-10-CM | POA: Diagnosis not present

## 2021-01-20 DIAGNOSIS — K648 Other hemorrhoids: Secondary | ICD-10-CM | POA: Diagnosis not present

## 2021-01-20 DIAGNOSIS — K449 Diaphragmatic hernia without obstruction or gangrene: Secondary | ICD-10-CM | POA: Diagnosis not present

## 2021-01-20 DIAGNOSIS — K7030 Alcoholic cirrhosis of liver without ascites: Secondary | ICD-10-CM | POA: Diagnosis not present

## 2021-01-20 DIAGNOSIS — K222 Esophageal obstruction: Secondary | ICD-10-CM | POA: Diagnosis not present

## 2021-01-20 DIAGNOSIS — Z8052 Family history of malignant neoplasm of bladder: Secondary | ICD-10-CM | POA: Diagnosis not present

## 2021-01-20 LAB — SARS CORONAVIRUS 2 (TAT 6-24 HRS): SARS Coronavirus 2: NEGATIVE

## 2021-01-21 ENCOUNTER — Ambulatory Visit
Admission: RE | Admit: 2021-01-21 | Discharge: 2021-01-21 | Disposition: A | Discharge status: Home / Self Care | Payer: BC Managed Care – PPO | Source: Ambulatory Visit | Attending: Gastroenterology | Admitting: Gastroenterology

## 2021-01-21 ENCOUNTER — Other Ambulatory Visit: Payer: Self-pay

## 2021-01-21 ENCOUNTER — Encounter (HOSPITAL_COMMUNITY): Payer: Self-pay | Admitting: Gastroenterology

## 2021-01-21 ENCOUNTER — Ambulatory Visit: Payer: BC Managed Care – PPO | Admitting: Addiction (Substance Use Disorder)

## 2021-01-21 DIAGNOSIS — K7030 Alcoholic cirrhosis of liver without ascites: Secondary | ICD-10-CM

## 2021-01-21 DIAGNOSIS — K760 Fatty (change of) liver, not elsewhere classified: Secondary | ICD-10-CM | POA: Diagnosis not present

## 2021-01-21 NOTE — Progress Notes (Signed)
Attempted to obtain medical history via telephone, unable to reach at this time. I left a voicemail to return pre surgical testing department's phone call.  

## 2021-01-22 NOTE — Anesthesia Preprocedure Evaluation (Addendum)
Anesthesia Evaluation  Patient identified by MRN, date of birth, ID band Patient awake    Reviewed: Allergy & Precautions, NPO status , Patient's Chart, lab work & pertinent test results  History of Anesthesia Complications Negative for: history of anesthetic complications  Airway Mallampati: II  TM Distance: >3 FB Neck ROM: Full    Dental no notable dental hx. (+) Dental Advisory Given   Pulmonary neg pulmonary ROS,    Pulmonary exam normal        Cardiovascular hypertension, Pt. on medications Normal cardiovascular exam     Neuro/Psych negative neurological ROS  negative psych ROS   GI/Hepatic (+)     substance abuse  alcohol use,   Endo/Other  negative endocrine ROS  Renal/GU negative Renal ROS     Musculoskeletal negative musculoskeletal ROS (+)   Abdominal   Peds  Hematology negative hematology ROS (+)   Anesthesia Other Findings   Reproductive/Obstetrics                            Anesthesia Physical Anesthesia Plan  ASA: 2  Anesthesia Plan: MAC   Post-op Pain Management:    Induction: Intravenous  PONV Risk Score and Plan: 1 and Ondansetron and Dexamethasone  Airway Management Planned: Natural Airway  Additional Equipment:   Intra-op Plan:   Post-operative Plan:   Informed Consent: I have reviewed the patients History and Physical, chart, labs and discussed the procedure including the risks, benefits and alternatives for the proposed anesthesia with the patient or authorized representative who has indicated his/her understanding and acceptance.     Dental advisory given  Plan Discussed with: Anesthesiologist and CRNA  Anesthesia Plan Comments:        Anesthesia Quick Evaluation

## 2021-01-23 ENCOUNTER — Encounter (HOSPITAL_COMMUNITY)
Admission: RE | Disposition: A | Discharge status: Home / Self Care | Payer: Self-pay | Source: Home / Self Care | Attending: Gastroenterology

## 2021-01-23 ENCOUNTER — Ambulatory Visit (HOSPITAL_COMMUNITY): Payer: BC Managed Care – PPO | Admitting: Anesthesiology

## 2021-01-23 ENCOUNTER — Other Ambulatory Visit: Payer: Self-pay

## 2021-01-23 ENCOUNTER — Ambulatory Visit: Payer: BC Managed Care – PPO | Admitting: Addiction (Substance Use Disorder)

## 2021-01-23 ENCOUNTER — Encounter (HOSPITAL_COMMUNITY): Payer: Self-pay | Admitting: Gastroenterology

## 2021-01-23 ENCOUNTER — Ambulatory Visit (HOSPITAL_COMMUNITY)
Admission: RE | Admit: 2021-01-23 | Discharge: 2021-01-23 | Disposition: A | Discharge status: Home / Self Care | Payer: BC Managed Care – PPO | Attending: Gastroenterology | Admitting: Gastroenterology

## 2021-01-23 DIAGNOSIS — K5730 Diverticulosis of large intestine without perforation or abscess without bleeding: Secondary | ICD-10-CM | POA: Diagnosis not present

## 2021-01-23 DIAGNOSIS — Z87891 Personal history of nicotine dependence: Secondary | ICD-10-CM | POA: Diagnosis not present

## 2021-01-23 DIAGNOSIS — K449 Diaphragmatic hernia without obstruction or gangrene: Secondary | ICD-10-CM | POA: Diagnosis not present

## 2021-01-23 DIAGNOSIS — K648 Other hemorrhoids: Secondary | ICD-10-CM | POA: Diagnosis not present

## 2021-01-23 DIAGNOSIS — K7030 Alcoholic cirrhosis of liver without ascites: Secondary | ICD-10-CM | POA: Insufficient documentation

## 2021-01-23 DIAGNOSIS — Z79899 Other long term (current) drug therapy: Secondary | ICD-10-CM | POA: Diagnosis not present

## 2021-01-23 DIAGNOSIS — K644 Residual hemorrhoidal skin tags: Secondary | ICD-10-CM | POA: Insufficient documentation

## 2021-01-23 DIAGNOSIS — Z8052 Family history of malignant neoplasm of bladder: Secondary | ICD-10-CM | POA: Diagnosis not present

## 2021-01-23 DIAGNOSIS — K8020 Calculus of gallbladder without cholecystitis without obstruction: Secondary | ICD-10-CM | POA: Insufficient documentation

## 2021-01-23 DIAGNOSIS — Z800 Family history of malignant neoplasm of digestive organs: Secondary | ICD-10-CM | POA: Insufficient documentation

## 2021-01-23 DIAGNOSIS — Z1211 Encounter for screening for malignant neoplasm of colon: Secondary | ICD-10-CM | POA: Diagnosis not present

## 2021-01-23 DIAGNOSIS — K2950 Unspecified chronic gastritis without bleeding: Secondary | ICD-10-CM | POA: Diagnosis not present

## 2021-01-23 DIAGNOSIS — Z20822 Contact with and (suspected) exposure to covid-19: Secondary | ICD-10-CM | POA: Insufficient documentation

## 2021-01-23 DIAGNOSIS — Z825 Family history of asthma and other chronic lower respiratory diseases: Secondary | ICD-10-CM | POA: Diagnosis not present

## 2021-01-23 DIAGNOSIS — K222 Esophageal obstruction: Secondary | ICD-10-CM | POA: Insufficient documentation

## 2021-01-23 DIAGNOSIS — K7460 Unspecified cirrhosis of liver: Secondary | ICD-10-CM | POA: Diagnosis not present

## 2021-01-23 DIAGNOSIS — I10 Essential (primary) hypertension: Secondary | ICD-10-CM | POA: Insufficient documentation

## 2021-01-23 DIAGNOSIS — K2970 Gastritis, unspecified, without bleeding: Secondary | ICD-10-CM | POA: Diagnosis not present

## 2021-01-23 HISTORY — PX: ESOPHAGOGASTRODUODENOSCOPY (EGD) WITH PROPOFOL: SHX5813

## 2021-01-23 HISTORY — PX: BIOPSY: SHX5522

## 2021-01-23 HISTORY — PX: COLONOSCOPY WITH PROPOFOL: SHX5780

## 2021-01-23 SURGERY — COLONOSCOPY WITH PROPOFOL
Anesthesia: Monitor Anesthesia Care

## 2021-01-23 MED ORDER — PROPOFOL 1000 MG/100ML IV EMUL
INTRAVENOUS | Status: AC
  Filled 2021-01-23: qty 100

## 2021-01-23 MED ORDER — LACTATED RINGERS IV SOLN: INTRAVENOUS | Status: DC

## 2021-01-23 MED ORDER — PROPOFOL 500 MG/50ML IV EMUL
INTRAVENOUS | Status: DC | PRN
  Administered 2021-01-23: 150 ug/kg/min via INTRAVENOUS

## 2021-01-23 MED ORDER — PROPOFOL 1000 MG/100ML IV EMUL
INTRAVENOUS | Status: AC
  Filled 2021-01-23: qty 200

## 2021-01-23 MED ORDER — SODIUM CHLORIDE 0.9 % IV SOLN: INTRAVENOUS | Status: DC

## 2021-01-23 MED ORDER — PROPOFOL 500 MG/50ML IV EMUL
INTRAVENOUS | Status: AC
  Filled 2021-01-23: qty 150

## 2021-01-23 SURGICAL SUPPLY — 24 items

## 2021-01-23 NOTE — H&P (Signed)
Primary Care Physician:  Tally Joe, MD Primary Gastroenterologist:  Dr Levora Angel  Reason for Visit : Outpatient EGD and colonoscopy  HPI: Paul [name] is a 55 y.o. male with past medical history of cirrhosis most likely from alcohol use here for outpatient EGD for variceal screening and colonoscopy for colon cancer screening.  Denies any acute GI issues today.  Past Medical History:  Diagnosis Date   Alcohol dependence (HCC)    Hypertension     Past Surgical History:  Procedure Laterality Date   ORTHOPEDIC SURGERY      Prior to Admission medications   Medication Sig Start Date End Date Taking? Authorizing Provider  amLODipine (NORVASC) 10 MG tablet Take 10 mg by mouth daily. 08/20/20  Yes [provider]  Multiple Vitamin (MULTIVITAMIN WITH MINERALS) TABS tablet Take 1 tablet by mouth daily.   Yes [provider]    Scheduled Meds: Continuous Infusions:  sodium chloride     lactated ringers 10 mL/hr at 01/23/21 0707   PRN Meds:.  Allergies as of 12/25/2020   (No Known Allergies)    Family History  Problem Relation Age of Onset   COPD Mother    Bladder Cancer Father    Esophageal cancer Maternal Grandmother     Social History   Socioeconomic History   Marital status: Married    Spouse name: Not on file   Number of children: Not on file   Years of education: Not on file   Highest education level: Not on file  Occupational History   Not on file  Tobacco Use   Smoking status: Never   Smokeless tobacco: Former    Types: Snuff    Quit date: 2010   Tobacco comments:    uses nicotine lozenges  Substance and Sexual Activity   Alcohol use: Yes   Drug use: Not Currently   Sexual activity: Not on file  Other Topics Concern   Not on file  Social History Narrative   Not on file   Social Determinants of Health   Financial Resource Strain: Not on file  Food Insecurity: Not on file  Transportation Needs: Not on file  Physical Activity:  Not on file  Stress: Not on file  Social Connections: Not on file  Intimate Partner Violence: Not on file     Physical Exam: Vital signs: Vitals:   01/23/21 0655  BP: (!) 142/77  Pulse: 65  Resp: 14  Temp: 98.6 F (37 C)  SpO2: 100%     General:   Alert,  Well-developed, well-nourished, pleasant and cooperative in NAD Lungs:  Clear throughout to auscultation.   No wheezes, crackles, or rhonchi. No acute distress. Heart:  Regular rate and rhythm; no murmurs, clicks, rubs,  or gallops. Abdomen: Soft, nontender, nondistended, bowel sounds present.  No peritoneal signs Rectal:  Deferred  GI:  Lab Results: No results for input(s): WBC, HGB, HCT, PLT in the last 72 hours. BMET No results for input(s): NA, K, CL, CO2, GLUCOSE, BUN, CREATININE, CALCIUM in the last 72 hours. LFT No results for input(s): PROT, ALBUMIN, AST, ALT, ALKPHOS, BILITOT, BILIDIR, IBILI in the last 72 hours. PT/INR No results for input(s): LABPROT, INR in the last 72 hours.   Studies/Results: US Abdomen Limited RUQ (LIVER/GB)  Result Date: 01/22/2021 CLINICAL DATA:  Alcoholic cirrhosis. EXAM: ULTRASOUND ABDOMEN LIMITED RIGHT UPPER QUADRANT COMPARISON:  Abdominal ultrasound and MRI in January of 2022 FINDINGS: Gallbladder: This partially distended containing intraluminal gallstones. No gallbladder wall thickening or pericholecystic fluid.  No sonographic Murphy sign noted by sonographer. Common bile duct: Diameter: 4 mm. Liver: Heterogeneous hepatic echogenicity, increased compared to right kidney. Lobulated hepatic contours. Echogenic focus in the right lobe of the liver measuring 4 mm may represent a calcified granuloma. No discrete focal lesion. Portal vein is patent on color Doppler imaging with normal direction of blood flow towards the liver. Other: No right upper quadrant ascites. IMPRESSION: 1. Heterogeneous increased hepatic echogenicity typical of steatosis or other hepatocellular disease. Lobulated  contours consistent with cirrhosis. No ascites. 2. No discrete focal lesion. 4 mm echogenic focus in the right lobe may represent a calcified granuloma. 3. Gallstones without sonographic findings of acute cholecystitis. Electronically Signed   By: Narda Rutherford M.D.   On: 01/22/2021 19:23    Impression/Plan: -Cirrhosis of the liver -Colon cancer screening  Recommendations ------------------------ -Proceed with EGD with possible band ligation and colonoscopy today.  Risks (bleeding, infection, bowel perforation that could require surgery, sedation-related changes in cardiopulmonary systems), benefits (identification and possible treatment of source of symptoms, exclusion of certain causes of symptoms), and alternatives (watchful waiting, radiographic imaging studies, empiric medical treatment)  were explained to patient/family in detail and patient wishes to proceed.     LOS: 0 days   Kathi Der  MD, FACP 01/23/2021, 7:34 AM  Contact #  [phone]

## 2021-01-23 NOTE — Transfer of Care (Signed)
Immediate Anesthesia Transfer of Care Note  Patient: Paul [name]  Procedure(s) Performed: Procedure(s): COLONOSCOPY WITH PROPOFOL (N/A) ESOPHAGOGASTRODUODENOSCOPY (EGD) WITH PROPOFOL (N/A) ESOPHAGEAL BANDING (N/A)  Patient Location: PACU and Endoscopy Unit  Anesthesia Type:MAC  Level of Consciousness: awake, alert  and oriented  Airway & Oxygen Therapy: Patient Spontanous Breathing and Patient connected to nasal cannula oxygen  Post-op Assessment: Report given to RN and Post -op Vital signs reviewed and stable  Post vital signs: Reviewed and stable  Last Vitals:  Vitals:   01/23/21 0655  BP: (!) 142/77  Pulse: 65  Resp: 14  Temp: 37 C  SpO2: 102%    Complications: No apparent anesthesia complications

## 2021-01-23 NOTE — Op Note (Signed)
[facility] Patient Name: Paul FraiseJonathan Milewski Procedure Date: 01/23/2021 MRN: [id number] Attending MD: Kathi DerParag Nivea Wojdyla , MD Date of Birth: 1966-04-21 CSN: [id number] Age: 55 Admit Type: Outpatient Procedure:                Upper GI endoscopy Indications:              Cirrhosis rule out esophageal varices Providers:                Kathi DerParag Juanda Luba, MD, Zoe LanJenny Kappus, RN, Sunday CornFaustina                            Mbumina, Technician Referring MD:              Medicines:                Sedation Administered by an Anesthesia Professional Complications:            No immediate complications. Estimated Blood Loss:     Estimated blood loss was minimal. Procedure:                Pre-Anesthesia Assessment:                           - Prior to the procedure, a History and Physical                            was performed, and patient medications and                            allergies were reviewed. The patient's tolerance of                            previous anesthesia was also reviewed. The risks                            and benefits of the procedure and the sedation                            options and risks were discussed with the patient.                            All questions were answered, and informed consent                            was obtained. Prior Anticoagulants: The patient has                            taken no previous anticoagulant or antiplatelet                            agents. ASA Grade Assessment: II - A patient with                            mild systemic disease. After reviewing the risks  and benefits, the patient was deemed in                            satisfactory condition to undergo the procedure.                           After obtaining informed consent, the endoscope was                            passed under direct vision. Throughout the                            procedure, the patient's blood pressure, pulse, and                             oxygen saturations were monitored continuously. The                            [id number]-[id number] ([id number]) Olympus gastroscope was                            introduced through the mouth, and advanced to the                            second part of duodenum. The upper GI endoscopy was                            accomplished without difficulty. The patient                            tolerated the procedure well. Scope In: Scope Out: Findings:      A widely patent Schatzki ring was found at the gastroesophageal junction.      A small hiatal hernia was present.      There is no endoscopic evidence of varices in the entire esophagus.      Scattered minimal inflammation characterized by erythema was found in       the gastric antrum. Biopsies were taken with a cold forceps for       histology.      The cardia and gastric fundus were normal on retroflexion.      The duodenal bulb, first portion of the duodenum and second portion of       the duodenum were normal. Impression:               - Widely patent Schatzki ring.                           - Small hiatal hernia.                           - Gastritis. Biopsied.                           - Normal duodenal bulb, first portion of the  duodenum and second portion of the duodenum. Moderate Sedation:      Moderate (conscious) sedation was personally administered by an       anesthesia professional. The following parameters were monitored: oxygen       saturation, heart rate, blood pressure, and response to care. Recommendation:           - Patient has a contact number available for                            emergencies. The signs and symptoms of potential                            delayed complications were discussed with the                            patient. Return to normal activities tomorrow.                            Written discharge instructions were provided to the                             patient.                           - Resume previous diet.                           - Continue present medications.                           - Await pathology results.                           - Perform a colonoscopy today. Procedure Code(s):        --- Professional ---                           [phone], Esophagogastroduodenoscopy, flexible,                            transoral; with biopsy, single or multiple Diagnosis Code(s):        --- Professional ---                           K22.2, Esophageal obstruction                           K44.9, Diaphragmatic hernia without obstruction or                            gangrene                           K29.70, Gastritis, unspecified, without bleeding                           K74.60, Unspecified cirrhosis of liver CPT copyright 2019 American Medical Association. All rights reserved.  The codes documented in this report are preliminary and upon coder review may  be revised to meet current compliance requirements. Kathi Der, MD Kathi Der, MD 01/23/2021 8:15:49 AM Number of Addenda: 0

## 2021-01-23 NOTE — Discharge Instructions (Signed)

## 2021-01-23 NOTE — Anesthesia Postprocedure Evaluation (Signed)
Anesthesia Post Note  Patient: Paul [name]  Procedure(s) Performed: COLONOSCOPY WITH PROPOFOL ESOPHAGOGASTRODUODENOSCOPY (EGD) WITH PROPOFOL ESOPHAGEAL BANDING     Patient location during evaluation: PACU Anesthesia Type: MAC Level of consciousness: awake and alert Pain management: pain level controlled Vital Signs Assessment: post-procedure vital signs reviewed and stable Respiratory status: spontaneous breathing and respiratory function stable Cardiovascular status: stable Postop Assessment: no apparent nausea or vomiting Anesthetic complications: no   No notable events documented.  Last Vitals:  Vitals:   01/23/21 0820 01/23/21 0825  BP: 118/70 130/76  Pulse:    Resp: 16 12  Temp:    SpO2: 100% 100%    Last Pain:  Vitals:   01/23/21 0825  TempSrc:   PainSc: 0-No pain                 Eriyah Fernando DANIEL

## 2021-01-23 NOTE — Op Note (Signed)
[facility] Patient Name: Paul [name] Procedure Date: 01/23/2021 MRN: [id number] Attending MD: Paul [name] , MD Date of Birth: [birth year] CSN: [id number] Age: 55 Admit Type: Outpatient Procedure:                Colonoscopy Indications:              Screening for colorectal malignant neoplasm, This                            is the patient's first colonoscopy Providers:                Paul Der, MD, Zoe Lan, RN, Sunday Corn                            Mbumina, Technician Referring MD:              Medicines:                Sedation Administered by an Anesthesia Professional Complications:            No immediate complications. Estimated Blood Loss:     Estimated blood loss was minimal. Procedure:                Pre-Anesthesia Assessment:                           - Prior to the procedure, a History and Physical                            was performed, and patient medications and                            allergies were reviewed. The patient's tolerance of                            previous anesthesia was also reviewed. The risks                            and benefits of the procedure and the sedation                            options and risks were discussed with the patient.                            All questions were answered, and informed consent                            was obtained. Prior Anticoagulants: The patient has                            taken no previous anticoagulant or antiplatelet                            agents. ASA Grade Assessment: II - A patient with  mild systemic disease. After reviewing the risks                            and benefits, the patient was deemed in                            satisfactory condition to undergo the procedure.                           - Prior to the procedure, a History and Physical                            was performed, and patient medications and                             allergies were reviewed. The patient's tolerance of                            previous anesthesia was also reviewed. The risks                            and benefits of the procedure and the sedation                            options and risks were discussed with the patient.                            All questions were answered, and informed consent                            was obtained. Prior Anticoagulants: The patient has                            taken no previous anticoagulant or antiplatelet                            agents. ASA Grade Assessment: II - A patient with                            mild systemic disease. After reviewing the risks                            and benefits, the patient was deemed in                            satisfactory condition to undergo the procedure.                           After obtaining informed consent, the colonoscope                            was passed under direct vision. Throughout the  procedure, the patient's blood pressure, pulse, and                            oxygen saturations were monitored continuously. The                            [id number]-[id number] ([id number] Olympus pediatric colonscope                            was introduced through the anus and advanced to the                            the cecum, identified by appendiceal orifice and                            ileocecal valve. The colonoscopy was performed                            without difficulty. The patient tolerated the                            procedure well. The quality of the bowel                            preparation was adequate to identify polyps 6 mm                            and larger in size. Scope In: 7:53:46 AM Scope Out: 8:08:42 AM Scope Withdrawal Time: 0 hours 9 minutes 0 seconds  Total Procedure Duration: 0 hours 14 minutes 56 seconds  Findings:      Skin tags were found on perianal exam.      A few  small-mouthed diverticula were found in the sigmoid colon.      Internal hemorrhoids were found during retroflexion. The hemorrhoids       were small.      The exam was otherwise without abnormality on direct and retroflexion       views. Impression:               - Perianal skin tags found on perianal exam.                           - Diverticulosis in the sigmoid colon.                           - Internal hemorrhoids.                           - The examination was otherwise normal on direct                            and retroflexion views.                           - No specimens collected. Moderate Sedation:      Moderate (conscious) sedation was personally administered by  an       anesthesia professional. The following parameters were monitored: oxygen       saturation, heart rate, blood pressure, and response to care. Recommendation:           - Patient has a contact number available for                            emergencies. The signs and symptoms of potential                            delayed complications were discussed with the                            patient. Return to normal activities tomorrow.                            Written discharge instructions were provided to the                            patient.                           - Resume previous diet.                           - Continue present medications.                           - Repeat colonoscopy in 10 years for screening                            purposes.                           - Return to GI office as previously scheduled. Procedure Code(s):        --- Professional ---                           X5284, Colorectal cancer screening; colonoscopy on                            individual not meeting criteria for high risk Diagnosis Code(s):        --- Professional ---                           Z12.11, Encounter for screening for malignant                            neoplasm of colon                            K64.8, Other hemorrhoids                           K64.4, Residual hemorrhoidal skin tags  K57.30, Diverticulosis of large intestine without                            perforation or abscess without bleeding CPT copyright 2019 American Medical Association. All rights reserved. The codes documented in this report are preliminary and upon coder review may  be revised to meet current compliance requirements. Paul DerParag Jeymi Hepp, MD Paul DerParag Paul Bonsignore, MD 01/23/2021 8:19:37 AM Number of Addenda: 0

## 2021-01-26 ENCOUNTER — Encounter (HOSPITAL_COMMUNITY): Payer: Self-pay | Admitting: Gastroenterology

## 2021-01-26 LAB — SURGICAL PATHOLOGY

## 2021-01-28 ENCOUNTER — Other Ambulatory Visit: Payer: Self-pay

## 2021-01-28 ENCOUNTER — Ambulatory Visit: Payer: BC Managed Care – PPO | Admitting: Addiction (Substance Use Disorder)

## 2021-01-28 DIAGNOSIS — F1021 Alcohol dependence, in remission: Secondary | ICD-10-CM
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
## 2021-02-04 ENCOUNTER — Other Ambulatory Visit: Payer: Self-pay

## 2021-02-04 ENCOUNTER — Ambulatory Visit: Payer: BC Managed Care – PPO | Admitting: Addiction (Substance Use Disorder)

## 2021-02-04 DIAGNOSIS — F4321 Adjustment disorder with depressed mood: Secondary | ICD-10-CM | POA: Diagnosis not present

## 2021-02-04 DIAGNOSIS — F1021 Alcohol dependence, in remission: Secondary | ICD-10-CM | POA: Diagnosis not present

## 2021-02-04 DIAGNOSIS — F4320 Adjustment disorder, unspecified: Secondary | ICD-10-CM

## 2021-02-04 NOTE — Progress Notes (Signed)
[facility] Counselor/Therapist Progress Note  Patient ID: Paul [name], MRN: [id number],    Date: 02/04/2021  Time Spent: 58 mins  Treatment Type: Individual Therapy  Reported Symptoms: tired.   Mental Status Exam:  Appearance:   Casual     Behavior:  Appropriate  Motor:  Normal  Speech/Language:   Clear and Coherent and Normal Rate  Affect:  Appropriate and Congruent  Mood:  anxious and sad   Thought process:  normal  Thought content:    Rumination  Sensory/Perceptual disturbances:    Flashback & intrusive thoughts  Orientation:  x4  Attention:  Good  Concentration:  Fair  Memory:  WNL  Fund of knowledge:   Good  Insight:    Good  Judgment:   Good  Impulse Control:  Good   Risk Assessment: Danger to Self:  No  Self-injurious Behavior: No- no longer binge drinking Danger to Others: No Duty to Warn:no Physical Aggression / Violence:No  Access to Firearms a concern: No  Gang Involvement:No   Subjective: Client processed being very tired about having to be very careful about how he says what he says. Client shared his inability to work above expectations due to having less capacity. Client reported setting  more boundaries at work and not working himself to the bone without taking breaks. Client working on not being hard on himself and also working on not doing extra work for [name]. Client understands that he is using more bandwidth at home with his wife. Client realizing he will need to be tip toeing around with his wife's emotional stability possibly for life, as she stabilizes emotionally. Therapist used MI & RPT with client to validate his struggles, support him as he processes his stressors related to caretaking for his wife, and help him think of continued ways to support his recovery and reduce stressors using his RP coping skills. Client also processed having thoughts about smoking for pain and anxiety relief and therapist used MI & RPT to help client play the  thought out to decide the risk of smoking for his health and recovery. Therapist assessed for safety and client denied having SI/HI/AVH but reported some cravings.  Interventions: Motivational Interviewing and RPT    Diagnosis:   ICD-10-CM   1. Alcohol use disorder, severe, in early remission, in controlled environment, dependence (HCC)  F10.21     2. Grief reaction  F43.21         Plan of Care: Client to return for weekly therapy with [name], therapist, for outpatient therapy, to review again in 3-6 months.  Client is to consider seeing medication provider for support of mood management, triggers and cravings following stabilization of his liver.  Client to follow through with adding more support for them in their SUD txt: engaging ina form of 12 step meetings and meeting with a sponsor/ mentor for daily support/accountability.  Client to engage in SA txt and RPT relapse prevention therapy AEB coming to therapy weekly and implementing recovery oriented coping strategies to help reduce use of substances and to find relief from symptoms of MH disorders and trauma that cause them to want to escape from reality and numb their mind&body.  Client also to create more stability & structure AEB goal planning with therapist to assist in helping them get into a healthy rhythm/ schedule that can help to calm their nervous system and begin to build more healthy brain neuropathways.  Client to engage in CBT: challenging negative internal ruminations  and self-talk AEB expressing toxic thoughts and challenging them with truth.  Client to practice DBT distress tolerance skills (such as distress tolerance and emotion regulation) to practice achieving wise mind and to build support for dealing with cravings AEB learning to ride the wave of emotion/sensation/etc instead of seeking negative self-soothing techniques: ie using substances to numb self.  Client to utilize BSP (brainspotting) with therapist to help  client identify and process triggers for their MH/ SUD with goal of reducing SUDs by 33% each session.  Client to prioritize sleep 8+ hours each week night AEB going to bed by 10pm each night.   [name], LCSW, LCAS, CCTP, CCS-I, BSP

## 2021-02-12 ENCOUNTER — Ambulatory Visit (INDEPENDENT_AMBULATORY_CARE_PROVIDER_SITE_OTHER): Payer: BC Managed Care – PPO | Admitting: Addiction (Substance Use Disorder)

## 2021-02-12 ENCOUNTER — Other Ambulatory Visit: Payer: Self-pay

## 2021-02-12 DIAGNOSIS — F1021 Alcohol dependence, in remission: Secondary | ICD-10-CM
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
## 2021-02-19 ENCOUNTER — Other Ambulatory Visit: Payer: Self-pay

## 2021-02-19 ENCOUNTER — Ambulatory Visit (INDEPENDENT_AMBULATORY_CARE_PROVIDER_SITE_OTHER): Payer: BC Managed Care – PPO | Admitting: Addiction (Substance Use Disorder)

## 2021-02-19 DIAGNOSIS — F1021 Alcohol dependence, in remission: Secondary | ICD-10-CM | POA: Diagnosis not present

## 2021-02-19 DIAGNOSIS — F4321 Adjustment disorder with depressed mood: Secondary | ICD-10-CM | POA: Diagnosis not present

## 2021-02-19 DIAGNOSIS — F4320 Adjustment disorder, unspecified: Secondary | ICD-10-CM
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
## 2021-02-25 ENCOUNTER — Ambulatory Visit (INDEPENDENT_AMBULATORY_CARE_PROVIDER_SITE_OTHER): Payer: BC Managed Care – PPO | Admitting: Addiction (Substance Use Disorder)

## 2021-02-25 ENCOUNTER — Other Ambulatory Visit: Payer: Self-pay

## 2021-02-25 DIAGNOSIS — F1021 Alcohol dependence, in remission: Secondary | ICD-10-CM

## 2021-02-25 DIAGNOSIS — F4310 Post-traumatic stress disorder, unspecified: Secondary | ICD-10-CM

## 2021-02-25 NOTE — Progress Notes (Signed)
[facility] Counselor/Therapist Progress Note  Patient ID: Paul [name], MRN: [id number],    Date: 02/25/2021  Time Spent: 54 mins  Treatment Type: Individual Therapy  Reported Symptoms: feeling stuck, heavy, insomnia  Mental Status Exam:  Appearance:   Casual     Behavior:  Appropriate  Motor:  Normal  Speech/Language:   Clear and Coherent and Normal Rate  Affect:  Appropriate and Congruent  Mood:  anxious   Thought process:  flight of ideas and tangential  Thought content:    Rumination  Sensory/Perceptual disturbances:    Flashback   Orientation:  x4  Attention:  Good  Concentration:  Fair  Memory:  WNL  Fund of knowledge:   Good  Insight:    Good  Judgment:   Fair  Impulse Control:  Fair   Risk Assessment: Danger to Self:  No  Self-injurious Behavior: No- no longer binge drinking Danger to Others: No Duty to Warn:no Physical Aggression / Violence:No  Access to Firearms a concern: No  Gang Involvement:No   Subjective: Client processed going through a grieving process with no longer being able to drink alcohol ever again. Client processed trying to reject the thought that only alcohol can give him relief- that it is destructive and incoherent to his thought life. Therapist used MI & CBT & RPT with client to support him in processing his thoughts/feelings, challenging irrational thoughts, and helping him to think of ways to cope with his thoughts/triggers with coping skills. Client described how he is using SMART recovery to help him "play the thought out" to challenge thoughts about drinking. Client also coming to terms with not being able to have an equation for how to meet his and his wife's goals for their mental health. Client processed the not so straight path of getting stable and coping with their conditions and his frustration with it not being as easy as seeing, treating and healing a broken bone. Therapist affirmed client's frustration and encouraged him to  continue on the path towards recovery. Therapist also assessed for safety and client denied having SI/HI/AVH.  Interventions: Cognitive Behavioral Therapy, Motivational Interviewing, and RPT    Diagnosis:   ICD-10-CM   1. Alcohol use disorder, severe, in early remission, in controlled environment, dependence (HCC)  F10.21     2. PTSD (post-traumatic stress disorder)  F43.10       Plan of Care: Client to return for weekly therapy with [name], therapist, for outpatient therapy, to review again in 3-6 months.  Client is to consider seeing medication provider for support of mood management, triggers and cravings following stabilization of his liver.  Client to follow through with adding more support for them in their SUD txt: engaging ina form of 12 step meetings and meeting with a sponsor/ mentor for daily support/accountability.  Client to engage in SA txt and RPT relapse prevention therapy AEB coming to therapy weekly and implementing recovery oriented coping strategies to help reduce use of substances and to find relief from symptoms of MH disorders and trauma that cause them to want to escape from reality and numb their mind&body.  Client also to create more stability & structure AEB goal planning with therapist to assist in helping them get into a healthy rhythm/ schedule that can help to calm their nervous system and begin to build more healthy brain neuropathways.  Client to engage in CBT: challenging negative internal ruminations and self-talk AEB expressing toxic thoughts and challenging them with truth.  Client to practice DBT distress tolerance skills (such as distress tolerance and emotion regulation) to practice achieving wise mind and to build support for dealing with cravings AEB learning to ride the wave of emotion/sensation/etc instead of seeking negative self-soothing techniques: ie using substances to numb self.  Client to utilize BSP (brainspotting) with therapist to help  client identify and process triggers for their MH/ SUD with goal of reducing SUDs by 33% each session.  Client to prioritize sleep 8+ hours each week night AEB going to bed by 10pm each night.   [name], LCSW, LCAS, CCTP, CCS-I, BSP

## 2021-03-03 DIAGNOSIS — M47819 Spondylosis without myelopathy or radiculopathy, site unspecified: Secondary | ICD-10-CM | POA: Diagnosis not present

## 2021-03-03 DIAGNOSIS — Z111 Encounter for screening for respiratory tuberculosis: Secondary | ICD-10-CM | POA: Diagnosis not present

## 2021-03-04 ENCOUNTER — Ambulatory Visit (INDEPENDENT_AMBULATORY_CARE_PROVIDER_SITE_OTHER): Payer: BC Managed Care – PPO | Admitting: Addiction (Substance Use Disorder)

## 2021-03-04 ENCOUNTER — Other Ambulatory Visit: Payer: Self-pay

## 2021-03-04 DIAGNOSIS — F1021 Alcohol dependence, in remission: Secondary | ICD-10-CM

## 2021-03-04 DIAGNOSIS — F4310 Post-traumatic stress disorder, unspecified: Secondary | ICD-10-CM | POA: Diagnosis not present
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
## 2021-03-11 ENCOUNTER — Other Ambulatory Visit: Payer: Self-pay

## 2021-03-11 ENCOUNTER — Ambulatory Visit (INDEPENDENT_AMBULATORY_CARE_PROVIDER_SITE_OTHER): Payer: BC Managed Care – PPO | Admitting: Addiction (Substance Use Disorder)

## 2021-03-11 DIAGNOSIS — F4310 Post-traumatic stress disorder, unspecified: Secondary | ICD-10-CM | POA: Diagnosis not present
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
## 2021-03-24 ENCOUNTER — Other Ambulatory Visit: Payer: Self-pay

## 2021-03-24 ENCOUNTER — Ambulatory Visit (INDEPENDENT_AMBULATORY_CARE_PROVIDER_SITE_OTHER): Payer: BC Managed Care – PPO | Admitting: Addiction (Substance Use Disorder)

## 2021-03-24 DIAGNOSIS — H9313 Tinnitus, bilateral: Secondary | ICD-10-CM | POA: Diagnosis not present

## 2021-03-24 DIAGNOSIS — I10 Essential (primary) hypertension: Secondary | ICD-10-CM | POA: Diagnosis not present

## 2021-03-24 DIAGNOSIS — M47819 Spondylosis without myelopathy or radiculopathy, site unspecified: Secondary | ICD-10-CM | POA: Diagnosis not present

## 2021-03-24 DIAGNOSIS — F1021 Alcohol dependence, in remission: Secondary | ICD-10-CM

## 2021-03-24 DIAGNOSIS — Z9181 History of falling: Secondary | ICD-10-CM | POA: Diagnosis not present

## 2021-03-24 DIAGNOSIS — F4310 Post-traumatic stress disorder, unspecified: Secondary | ICD-10-CM | POA: Diagnosis not present
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
## 2021-04-02 ENCOUNTER — Ambulatory Visit (INDEPENDENT_AMBULATORY_CARE_PROVIDER_SITE_OTHER): Payer: BC Managed Care – PPO | Admitting: Addiction (Substance Use Disorder)

## 2021-04-02 ENCOUNTER — Other Ambulatory Visit: Payer: Self-pay

## 2021-04-02 DIAGNOSIS — F4310 Post-traumatic stress disorder, unspecified: Secondary | ICD-10-CM

## 2021-04-02 DIAGNOSIS — F1021 Alcohol dependence, in remission: Secondary | ICD-10-CM | POA: Diagnosis not present

## 2021-04-02 NOTE — Progress Notes (Signed)
[facility] Counselor/Therapist Progress Note  Patient ID: Paul [name], MRN: [id number],    Date: 04/02/2021  Time Spent:  Treatment Type: Individual Therapy  Reported Symptoms: motivated, some obsessions & discomfort   Mental Status Exam:  Appearance:   Casual     Behavior:  Appropriate  Motor:  Normal  Speech/Language:   Clear and Coherent and Normal Rate  Affect:  Appropriate, Congruent, and Full Range  Mood:  normal   Thought process:  normal  Thought content:    Obsessions and Rumination  Sensory/Perceptual disturbances:    Flashback   Orientation:  x4  Attention:  [name]  Concentration:  Fair  Memory:  WNL  Fund of knowledge:   [name]  Insight:    [name]  Judgment:   [name]  Impulse Control:  Fair   Risk Assessment: Danger to Self:  No  Self-injurious Behavior: No- no longer binge drinking Danger to Others: No Duty to Warn:no Physical Aggression / Violence:No  Access to Firearms a concern: No  Gang Involvement:No   Subjective: Client processed feeling motivated to work on his assertiveness. Client described situations he needed to have more assertiveness in his tone and consequences for not doing it. Client recognizes its his people pleasing nature that causes it along with the discomfort of that that makes him not want to do it. Therapist used MI, CBT, & DBT with client to support him, validate his need to honor his needs and ask for what he needs, while processing his emotions and thoughts and noticing his distress felt in moments he thinks about being assertive. Therapist used BSP & DBT with client to help client find a resource spot that helps to calm him and increase his distress tolerance and help him to say what he needs to. Client made progress practicing it and processing his needs. Therapist also assessed for safety and client denied having SI/HI/AVH.   Interventions: Cognitive Behavioral Therapy, Dialectical Behavioral Therapy, Motivational  Interviewing, and RPT    Diagnosis:   ICD-10-CM   1. PTSD (post-traumatic stress disorder)  F43.10     2. Alcohol use disorder, severe, in early remission, in controlled environment, dependence (HCC)  F10.21       Plan of Care: Client to return for weekly therapy with [name], therapist, for outpatient therapy, to review again in 3-6 months.  Client is to consider seeing medication provider for support of mood management, triggers and cravings following stabilization of his liver.  Client to follow through with adding more support for them in their SUD txt: engaging ina form of 12 step meetings and meeting with a sponsor/ mentor for daily support/accountability.  Client to engage in SA txt and RPT relapse prevention therapy AEB coming to therapy weekly and implementing recovery oriented coping strategies to help reduce use of substances and to find relief from symptoms of MH disorders and trauma that cause them to want to escape from reality and numb their mind&body.  Client also to create more stability & structure AEB goal planning with therapist to assist in helping them get into a healthy rhythm/ schedule that can help to calm their nervous system and begin to build more healthy brain neuropathways.  Client to engage in CBT: challenging negative internal ruminations and self-talk AEB expressing toxic thoughts and challenging them with truth.  Client to practice DBT distress tolerance skills (such as distress tolerance and emotion regulation) to practice achieving wise mind and to build support for dealing with cravings  AEB learning to ride the wave of emotion/sensation/etc instead of seeking negative self-soothing techniques: ie using substances to numb self.  Client to utilize BSP (brainspotting) with therapist to help client identify and process triggers for their MH/ SUD with goal of reducing SUDs by 33% each session.  Client to prioritize sleep 8+ hours each week night AEB going to bed  by 10pm each night.   Paul Good, LCSW, LCAS, CCTP, CCS-I, BSP

## 2021-04-08 ENCOUNTER — Ambulatory Visit: Payer: BC Managed Care – PPO | Admitting: Addiction (Substance Use Disorder)

## 2021-04-15 ENCOUNTER — Ambulatory Visit (INDEPENDENT_AMBULATORY_CARE_PROVIDER_SITE_OTHER): Payer: BC Managed Care – PPO | Admitting: Addiction (Substance Use Disorder)

## 2021-04-15 ENCOUNTER — Other Ambulatory Visit: Payer: Self-pay

## 2021-04-15 DIAGNOSIS — F1021 Alcohol dependence, in remission: Secondary | ICD-10-CM

## 2021-04-15 DIAGNOSIS — F4310 Post-traumatic stress disorder, unspecified: Secondary | ICD-10-CM | POA: Diagnosis not present

## 2021-04-15 NOTE — Progress Notes (Signed)
[facility] Counselor/Therapist Progress Note  Patient ID: Paul [name], MRN: [id number],    Date: 04/15/2021  Time Spent:  Treatment Type: Individual Therapy  Reported Symptoms: lonely, uncomfortable   Mental Status Exam:  Appearance:   Casual     Behavior:  Appropriate  Motor:  Normal  Speech/Language:   Clear and Coherent and Normal Rate  Affect:  Appropriate, Congruent, and Full Range  Mood:  anxious and sad   Thought process:  normal  Thought content:    Obsessions and Rumination  Sensory/Perceptual disturbances:    Flashback   Orientation:  x4  Attention:  Good  Concentration:  Fair  Memory:  WNL  Fund of knowledge:   Good  Insight:    Good  Judgment:   Good  Impulse Control:  Fair   Risk Assessment: Danger to Self:  No  Self-injurious Behavior: No- no longer binge drinking Danger to Others: No Duty to Warn:no Physical Aggression / Violence:No  Access to Firearms a concern: No  Gang Involvement:No   Subjective: Client processed feeling sad, powerless and alone. Client unsure of how to try to care for himself while trying to regulate his wife. Client feeling extremely dysregulated by hearing secondary trauma and therapist used DBT with client to help him regulate emotionally. Client reported having gone to therapy with his wife who is dealing with parts due to her childhood traumas. Client reported that his wife lost motor control and touch with the present and had another part come up in session. Client described how he caretook for her and the sadness it brought him. Therapist had client describe what it felt like to see his wife like that and used MI to support client in processing the hard details. Therapist also used CBT with client to help him process his emotions and thoughts in relation to his wife's spiraling. Therapist also used RPT with client to help him consider ways to help him continue coping with any triggers. Therapist also assessed for  safety and client denied having SI/HI/AVH.   Interventions: Cognitive Behavioral Therapy, Dialectical Behavioral Therapy, Motivational Interviewing, and RPT    Diagnosis:   ICD-10-CM   1. PTSD (post-traumatic stress disorder)  F43.10     2. Alcohol use disorder, severe, in early remission, in controlled environment, dependence (HCC)  F10.21        Plan of Care: Client to return for weekly therapy with Paul [name], therapist, for outpatient therapy, to review again in 3-6 months.  Client is to consider seeing medication provider for support of mood management, triggers and cravings following stabilization of his liver.  Client to follow through with adding more support for them in their SUD txt: engaging ina form of 12 step meetings and meeting with a sponsor/ mentor for daily support/accountability.  Client to engage in SA txt and RPT relapse prevention therapy AEB coming to therapy weekly and implementing recovery oriented coping strategies to help reduce use of substances and to find relief from symptoms of MH disorders and trauma that cause them to want to escape from reality and numb their mind&body.  Client also to create more stability & structure AEB goal planning with therapist to assist in helping them get into a healthy rhythm/ schedule that can help to calm their nervous system and begin to build more healthy brain neuropathways.  Client to engage in CBT: challenging negative internal ruminations and self-talk AEB expressing toxic thoughts and challenging them with truth.  Client to practice  DBT distress tolerance skills (such as distress tolerance and emotion regulation) to practice achieving wise mind and to build support for dealing with cravings AEB learning to ride the wave of emotion/sensation/etc instead of seeking negative self-soothing techniques: ie using substances to numb self.  Client to utilize BSP (brainspotting) with therapist to help client identify and process  triggers for their MH/ SUD with goal of reducing SUDs by 33% each session.  Client to prioritize sleep 8+ hours each week night AEB going to bed by 10pm each night.   [name], LCSW, LCAS, CCTP, CCS-I, BSP

## 2021-04-27 ENCOUNTER — Ambulatory Visit (INDEPENDENT_AMBULATORY_CARE_PROVIDER_SITE_OTHER): Payer: BC Managed Care – PPO | Admitting: Addiction (Substance Use Disorder)

## 2021-04-27 ENCOUNTER — Other Ambulatory Visit: Payer: Self-pay

## 2021-04-27 DIAGNOSIS — F4310 Post-traumatic stress disorder, unspecified: Secondary | ICD-10-CM

## 2021-04-27 DIAGNOSIS — F1021 Alcohol dependence, in remission: Secondary | ICD-10-CM | POA: Diagnosis not present

## 2021-04-27 NOTE — Progress Notes (Signed)
[facility] Counselor/Therapist Progress Note  Patient ID: Paul [name], MRN: [id number],    Date: 04/27/2021  Time Spent:  Treatment Type: Individual Therapy  Reported Symptoms: afraid & emotional  Mental Status Exam:  Appearance:   Casual     Behavior:  Appropriate  Motor:  Normal  Speech/Language:   Clear and Coherent and Normal Rate  Affect:  Appropriate, Congruent, and Full Range  Mood:  anxious, labile, and sad   Thought process:  normal  Thought content:    Obsessions and Rumination  Sensory/Perceptual disturbances:    Flashback   Orientation:  x4  Attention:  Good  Concentration:  Fair  Memory:  WNL  Fund of knowledge:   Good  Insight:    Good  Judgment:   Good  Impulse Control:  Fair   Risk Assessment: Danger to Self:  No  Self-injurious Behavior: No- no longer binge drinking Danger to Others: No Duty to Warn:no Physical Aggression / Violence:No  Access to Firearms a concern: No  Gang Involvement:No   Subjective: Client processed feeling emotional and helpless with watching his wife further struggle with feeling alive and well. Client expressed his concern for his wife if she continues to lose her centered self to these traumatized parts that he keeps having to interact with. Client and therapist used IFS to look at things with his wife in a different lens and therapist used MI to affirm clients feelings and support him as he processed the fears he has about his wife's recovery or lack their of. Client reported his worst fear of her not getting better and therapist used CBT with client to help challenge that catastrophic thinking and DBT to help client regulate. Therapist also assessed for safety and client denied having SI/HI/AVH.   Interventions: Cognitive Behavioral Therapy, Dialectical Behavioral Therapy, Motivational Interviewing, and RPT    Diagnosis:   ICD-10-CM   1. PTSD (post-traumatic stress disorder)  F43.10     2. Alcohol use  disorder, severe, in early remission, in controlled environment, dependence (HCC)  F10.21       Plan of Care: Client to return for weekly therapy with [name], therapist, for outpatient therapy, to review again in 3-6 months.  Client is to consider seeing medication provider for support of mood management, triggers and cravings following stabilization of his liver.  Client to follow through with adding more support for them in their SUD txt: engaging ina form of 12 step meetings and meeting with a sponsor/ mentor for daily support/accountability.  Client to engage in SA txt and RPT relapse prevention therapy AEB coming to therapy weekly and implementing recovery oriented coping strategies to help reduce use of substances and to find relief from symptoms of MH disorders and trauma that cause them to want to escape from reality and numb their mind&body.  Client also to create more stability & structure AEB goal planning with therapist to assist in helping them get into a healthy rhythm/ schedule that can help to calm their nervous system and begin to build more healthy brain neuropathways.  Client to engage in CBT: challenging negative internal ruminations and self-talk AEB expressing toxic thoughts and challenging them with truth.  Client to practice DBT distress tolerance skills (such as distress tolerance and emotion regulation) to practice achieving wise mind and to build support for dealing with cravings AEB learning to ride the wave of emotion/sensation/etc instead of seeking negative self-soothing techniques: ie using substances to numb self.  Client  to utilize BSP (brainspotting) with therapist to help client identify and process triggers for their MH/ SUD with goal of reducing SUDs by 33% each session.  Client to prioritize sleep 8+ hours each week night AEB going to bed by 10pm each night.   [name], LCSW, LCAS, CCTP, CCS-I, BSP

## 2021-05-05 ENCOUNTER — Other Ambulatory Visit: Payer: Self-pay

## 2021-05-05 ENCOUNTER — Ambulatory Visit (INDEPENDENT_AMBULATORY_CARE_PROVIDER_SITE_OTHER): Payer: BC Managed Care – PPO | Admitting: Addiction (Substance Use Disorder)

## 2021-05-05 DIAGNOSIS — F4310 Post-traumatic stress disorder, unspecified: Secondary | ICD-10-CM | POA: Diagnosis not present

## 2021-05-05 DIAGNOSIS — F4321 Adjustment disorder with depressed mood: Secondary | ICD-10-CM

## 2021-05-05 NOTE — Progress Notes (Signed)
[facility] Counselor/Therapist Progress Note  Patient ID: Paul [name], MRN: [id number],    Date: 05/05/2021  Time Spent:  Treatment Type: Individual Therapy  Reported Symptoms: precipatory grief, hopelessness  Mental Status Exam:  Appearance:   Casual     Behavior:  Appropriate  Motor:  Normal  Speech/Language:   Clear and Coherent and Normal Rate  Affect:  Appropriate, Congruent, and Full Range  Mood:  anxious, labile, and sad   Thought process:  normal  Thought content:    Obsessions and Rumination  Sensory/Perceptual disturbances:    Flashback   Orientation:  x4  Attention:  [name]  Concentration:  Fair  Memory:  WNL  Fund of knowledge:   [name]  Insight:    [name]  Judgment:   [name]  Impulse Control:  Fair   Risk Assessment: Danger to Self:  No  Self-injurious Behavior: No- no longer binge drinking Danger to Others: No Duty to Warn:no Physical Aggression / Violence:No  Access to Firearms a concern: No  Gang Involvement:No   Subjective: Client processed feeling hopeless in watching his wife not be able to handle anything from spending time in [name] with him to planning a party for her granddaugher. Therapist worked with client on processing the grief he is dealing with as he watches the stages his wife goes through in her trauma treatment. Client processed his fears that one day he would lose his sobriety and ability to be in the "manager role" and be responsible. Therapist used MI & CBT with client to validate his feelings and support him in processing his thoughts. Client processed his fears that are catastrophic and his way of not engaging with them. Therapist used RPT with client to help him find an emotionally regulated way of not engaging with the negative black and white thinking. Therapist also assessed for safety and client denied having SI/HI/AVH and denied any drinking/ cravings. Therapist still encouraged client to research more about craving-blocking  medications to help him stay sober.   Interventions: Cognitive Behavioral Therapy, Motivational Interviewing, Grief Therapy, and RPT    Diagnosis:   ICD-10-CM   1. PTSD (post-traumatic stress disorder)  F43.10     2. Grief reaction  F43.21       Plan of Care: Client to return for weekly therapy with [name], therapist, for outpatient therapy, to review again in 3-6 months.  Client is to consider seeing medication provider for support of mood management, triggers and cravings following stabilization of his liver.  Client to follow through with adding more support for them in their SUD txt: engaging ina form of 12 step meetings and meeting with a sponsor/ mentor for daily support/accountability.  Client to engage in SA txt and RPT relapse prevention therapy AEB coming to therapy weekly and implementing recovery oriented coping strategies to help reduce use of substances and to find relief from symptoms of MH disorders and trauma that cause them to want to escape from reality and numb their mind&body.  Client also to create more stability & structure AEB goal planning with therapist to assist in helping them get into a healthy rhythm/ schedule that can help to calm their nervous system and begin to build more healthy brain neuropathways.  Client to engage in CBT: challenging negative internal ruminations and self-talk AEB expressing toxic thoughts and challenging them with truth.  Client to practice DBT distress tolerance skills (such as distress tolerance and emotion regulation) to practice achieving wise mind and to  build support for dealing with cravings AEB learning to ride the wave of emotion/sensation/etc instead of seeking negative self-soothing techniques: ie using substances to numb self.  Client to utilize BSP (brainspotting) with therapist to help client identify and process triggers for their MH/ SUD with goal of reducing SUDs by 33% each session.  Client to prioritize sleep 8+  hours each week night AEB going to bed by 10pm each night.   Paul Good, LCSW, LCAS, CCTP, CCS-I, BSP

## 2021-05-11 ENCOUNTER — Ambulatory Visit: Payer: BC Managed Care – PPO | Admitting: Addiction (Substance Use Disorder)

## 2021-05-21 ENCOUNTER — Ambulatory Visit (INDEPENDENT_AMBULATORY_CARE_PROVIDER_SITE_OTHER): Payer: BC Managed Care – PPO | Admitting: Addiction (Substance Use Disorder)

## 2021-05-21 ENCOUNTER — Other Ambulatory Visit: Payer: Self-pay

## 2021-05-21 DIAGNOSIS — F1021 Alcohol dependence, in remission: Secondary | ICD-10-CM | POA: Diagnosis not present

## 2021-05-21 DIAGNOSIS — F4310 Post-traumatic stress disorder, unspecified: Secondary | ICD-10-CM

## 2021-05-21 NOTE — Progress Notes (Signed)
[facility] Counselor/Therapist Progress Note  Patient ID: Paul [name], MRN: [id number],    Date: 05/21/2021  Time Spent:  Treatment Type: Individual Therapy  Reported Symptoms: exhausted  Mental Status Exam:  Appearance:   Casual     Behavior:  Appropriate  Motor:  Normal  Speech/Language:   Clear and Coherent and Normal Rate  Affect:  Appropriate, Congruent, and Full Range  Mood:  decreased range and labile   Thought process:  normal  Thought content:    Obsessions and Rumination  Sensory/Perceptual disturbances:    Flashback   Orientation:  x4  Attention:  Good  Concentration:  Fair  Memory:  WNL  Fund of knowledge:   Good  Insight:    Good  Judgment:   Good  Impulse Control:  Fair   Risk Assessment: Danger to Self:  No  Self-injurious Behavior: No- no longer binge drinking Danger to Others: No Duty to Warn:no Physical Aggression / Violence:No  Access to Firearms a concern: No  Gang Involvement:No   Subjective: Client processed hosting the baby shower that wore him out emotionally. Client having very little bandwith to do anything other than the bare minimum. Client processed more about his stress level and cravings to drink or find relief but how he has mentally decided its not an option because it could kill him. Therapist used MI & RPT with client to validate the struggle of stress but also encourage ongoing sobriety and ways to help him cope with the cravings. Client further processed the party and described how his wife Amy interacted with her friends in a way they didn't recognize, due to her other parts coming up when she feels unsafe or vulnerable. Client processed the pain it causes him seeing her isolated from her friends and others esp at the party last week. Client grieving and preparing for the potentially deathly illness she has due to her multiple parts she is struggling with that dont want her to "keep going". Client denied that his wife was  suicidal and reported she was processing these things with her therapist and him. Therapist also assessed for safety and client denied having SI/HI/AVH and denied any drinking/ cravings.   Interventions: Cognitive Behavioral Therapy, Motivational Interviewing, Grief Therapy, and RPT    Diagnosis:   ICD-10-CM   1. PTSD (post-traumatic stress disorder)  F43.10     2. Alcohol use disorder, severe, in early remission, in controlled environment, dependence (HCC)  F10.21       Plan of Care: Client to return for weekly therapy with [name], therapist, for outpatient therapy, to review again in 3-6 months.  Client is to consider seeing medication provider for support of mood management, triggers and cravings following stabilization of his liver.  Client to follow through with adding more support for them in their SUD txt: engaging ina form of 12 step meetings and meeting with a sponsor/ mentor for daily support/accountability.  Client to engage in SA txt and RPT relapse prevention therapy AEB coming to therapy weekly and implementing recovery oriented coping strategies to help reduce use of substances and to find relief from symptoms of MH disorders and trauma that cause them to want to escape from reality and numb their mind&body.  Client also to create more stability & structure AEB goal planning with therapist to assist in helping them get into a healthy rhythm/ schedule that can help to calm their nervous system and begin to build more healthy brain neuropathways.  Client to engage in CBT: challenging negative internal ruminations and self-talk AEB expressing toxic thoughts and challenging them with truth.  Client to practice DBT distress tolerance skills (such as distress tolerance and emotion regulation) to practice achieving wise mind and to build support for dealing with cravings AEB learning to ride the wave of emotion/sensation/etc instead of seeking negative self-soothing techniques: ie  using substances to numb self.  Client to utilize BSP (brainspotting) with therapist to help client identify and process triggers for their MH/ SUD with goal of reducing SUDs by 33% each session.  Client to prioritize sleep 8+ hours each week night AEB going to bed by 10pm each night.   [name], LCSW, LCAS, CCTP, CCS-I, BSP

## 2021-05-25 ENCOUNTER — Other Ambulatory Visit: Payer: Self-pay

## 2021-05-25 ENCOUNTER — Ambulatory Visit (INDEPENDENT_AMBULATORY_CARE_PROVIDER_SITE_OTHER): Payer: BC Managed Care – PPO | Admitting: Addiction (Substance Use Disorder)

## 2021-05-25 DIAGNOSIS — F4321 Adjustment disorder with depressed mood: Secondary | ICD-10-CM | POA: Diagnosis not present

## 2021-05-25 DIAGNOSIS — F4310 Post-traumatic stress disorder, unspecified: Secondary | ICD-10-CM | POA: Diagnosis not present

## 2021-05-25 DIAGNOSIS — F4320 Adjustment disorder, unspecified: Secondary | ICD-10-CM
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
## 2021-06-01 DIAGNOSIS — M47819 Spondylosis without myelopathy or radiculopathy, site unspecified: Secondary | ICD-10-CM | POA: Diagnosis not present

## 2021-06-01 DIAGNOSIS — R82998 Other abnormal findings in urine: Secondary | ICD-10-CM | POA: Diagnosis not present

## 2021-06-04 ENCOUNTER — Other Ambulatory Visit: Payer: Self-pay

## 2021-06-04 ENCOUNTER — Ambulatory Visit (INDEPENDENT_AMBULATORY_CARE_PROVIDER_SITE_OTHER): Payer: BC Managed Care – PPO | Admitting: Addiction (Substance Use Disorder)

## 2021-06-04 DIAGNOSIS — F4310 Post-traumatic stress disorder, unspecified: Secondary | ICD-10-CM | POA: Diagnosis not present

## 2021-06-04 NOTE — Progress Notes (Signed)
[facility] Counselor/Therapist Progress Note  Patient ID: Paul [name], MRN: [id number],    Date: 06/04/2021  Time Spent:  Treatment Type: Individual Therapy  Reported Symptoms: really afraid  Mental Status Exam:  Appearance:   Casual     Behavior:  Appropriate  Motor:  Normal  Speech/Language:   Clear and Coherent and Normal Rate  Affect:  Appropriate, Congruent, and Full Range  Mood:  anxious, labile, and sad   Thought process:  normal  Thought content:    Obsessions and Rumination  Sensory/Perceptual disturbances:    Flashback   Orientation:  x4  Attention:  Good  Concentration:  Fair  Memory:  WNL  Fund of knowledge:   Good  Insight:    Good  Judgment:   Good  Impulse Control:  Fair   Risk Assessment: Danger to Self:  No  Self-injurious Behavior: No- no longer binge drinking Danger to Others: No Duty to Warn:no Physical Aggression / Violence:No  Access to Firearms a concern: No  Gang Involvement:No   Subjective: Client processed feeling very afraid all over that he could die after hearing back about his liver numbers in critical ranges. Client expressed his concern to his doctor that something wasn't right and he was nervous it could be his Humara meds for his arthritis. Client was unfortunately right and is having regrets for trusting what the doctors said: that it would be fine for him to take it. Client grieving the results and processed his fear of leaving his wife alone in this world if he passed. Client griveing and therapist offered validation (MI) & mindfulness to help emotionally regulate him and ground him. Therapist also used Grief therapy and BSP to help client process the stress/fear in his body, despite the rational calm thoughts he is trying to tell himself, like he will be okay.  Therapist also assessed for safety and client denied having SI/HI/AVH and denied any drinking/ cravings.   Interventions: Cognitive Behavioral Therapy,  Mindfulness Meditation, Motivational Interviewing, Grief Therapy, and RPT    Diagnosis:   ICD-10-CM   1. PTSD (post-traumatic stress disorder)  F43.10       Plan of Care: Client to return for weekly therapy with [name], therapist, for outpatient therapy, to review again in 3-6 months.  Client is to consider seeing medication provider for support of mood management, triggers and cravings following stabilization of his liver.  Client to follow through with adding more support for them in their SUD txt: engaging ina form of 12 step meetings and meeting with a sponsor/ mentor for daily support/accountability.  Client to engage in SA txt and RPT relapse prevention therapy AEB coming to therapy weekly and implementing recovery oriented coping strategies to help reduce use of substances and to find relief from symptoms of MH disorders and trauma that cause them to want to escape from reality and numb their mind&body.  Client also to create more stability & structure AEB goal planning with therapist to assist in helping them get into a healthy rhythm/ schedule that can help to calm their nervous system and begin to build more healthy brain neuropathways.  Client to engage in CBT: challenging negative internal ruminations and self-talk AEB expressing toxic thoughts and challenging them with truth.  Client to practice DBT distress tolerance skills (such as distress tolerance and emotion regulation) to practice achieving wise mind and to build support for dealing with cravings AEB learning to ride the wave of emotion/sensation/etc instead of seeking  negative self-soothing techniques: ie using substances to numb self.  Client to utilize BSP (brainspotting) with therapist to help client identify and process triggers for their MH/ SUD with goal of reducing SUDs by 33% each session.  Client to prioritize sleep 8+ hours each week night AEB going to bed by 10pm each night.   [name], LCSW, LCAS, CCTP,  CCS-I, BSP

## 2021-06-08 ENCOUNTER — Ambulatory Visit: Payer: BC Managed Care – PPO | Admitting: Addiction (Substance Use Disorder)

## 2021-06-08 DIAGNOSIS — R945 Abnormal results of liver function studies: Secondary | ICD-10-CM | POA: Diagnosis not present

## 2021-06-09 ENCOUNTER — Other Ambulatory Visit: Payer: Self-pay | Admitting: Gastroenterology

## 2021-06-09 DIAGNOSIS — K719 Toxic liver disease, unspecified: Secondary | ICD-10-CM | POA: Diagnosis not present

## 2021-06-09 DIAGNOSIS — R945 Abnormal results of liver function studies: Secondary | ICD-10-CM | POA: Diagnosis not present

## 2021-06-09 DIAGNOSIS — R634 Abnormal weight loss: Secondary | ICD-10-CM | POA: Diagnosis not present

## 2021-06-09 DIAGNOSIS — R7989 Other specified abnormal findings of blood chemistry: Secondary | ICD-10-CM

## 2021-06-09 DIAGNOSIS — R799 Abnormal finding of blood chemistry, unspecified: Secondary | ICD-10-CM | POA: Diagnosis not present

## 2021-06-09 DIAGNOSIS — L299 Pruritus, unspecified: Secondary | ICD-10-CM | POA: Diagnosis not present

## 2021-06-15 ENCOUNTER — Ambulatory Visit: Payer: BC Managed Care – PPO | Admitting: Addiction (Substance Use Disorder)

## 2021-06-17 DIAGNOSIS — R799 Abnormal finding of blood chemistry, unspecified: Secondary | ICD-10-CM | POA: Diagnosis not present

## 2021-06-17 DIAGNOSIS — R945 Abnormal results of liver function studies: Secondary | ICD-10-CM | POA: Diagnosis not present

## 2021-06-22 ENCOUNTER — Other Ambulatory Visit: Payer: Self-pay

## 2021-06-22 ENCOUNTER — Ambulatory Visit (INDEPENDENT_AMBULATORY_CARE_PROVIDER_SITE_OTHER): Payer: BC Managed Care – PPO | Admitting: Addiction (Substance Use Disorder)

## 2021-06-22 DIAGNOSIS — F4310 Post-traumatic stress disorder, unspecified: Secondary | ICD-10-CM | POA: Diagnosis not present

## 2021-06-22 DIAGNOSIS — F1021 Alcohol dependence, in remission: Secondary | ICD-10-CM | POA: Diagnosis not present

## 2021-06-22 DIAGNOSIS — F4321 Adjustment disorder with depressed mood: Secondary | ICD-10-CM | POA: Diagnosis not present
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
## 2021-06-24 ENCOUNTER — Ambulatory Visit
Admission: RE | Admit: 2021-06-24 | Discharge: 2021-06-24 | Disposition: A | Discharge status: Home / Self Care | Payer: BC Managed Care – PPO | Source: Ambulatory Visit | Attending: Gastroenterology | Admitting: Gastroenterology

## 2021-06-24 DIAGNOSIS — R7989 Other specified abnormal findings of blood chemistry: Secondary | ICD-10-CM

## 2021-06-24 DIAGNOSIS — R17 Unspecified jaundice: Secondary | ICD-10-CM | POA: Diagnosis not present

## 2021-06-24 DIAGNOSIS — K8020 Calculus of gallbladder without cholecystitis without obstruction: Secondary | ICD-10-CM | POA: Diagnosis not present

## 2021-06-24 DIAGNOSIS — R634 Abnormal weight loss: Secondary | ICD-10-CM

## 2021-06-24 MED ORDER — IOPAMIDOL (ISOVUE-300) INJECTION 61%
100.0000 mL | Freq: Once | INTRAVENOUS | Status: AC | PRN
  Administered 2021-06-24: 100 mL via INTRAVENOUS

## 2021-07-08 DIAGNOSIS — R7989 Other specified abnormal findings of blood chemistry: Secondary | ICD-10-CM | POA: Diagnosis not present

## 2021-07-08 DIAGNOSIS — R945 Abnormal results of liver function studies: Secondary | ICD-10-CM | POA: Diagnosis not present

## 2021-07-16 ENCOUNTER — Other Ambulatory Visit: Payer: Self-pay

## 2021-07-16 ENCOUNTER — Ambulatory Visit (INDEPENDENT_AMBULATORY_CARE_PROVIDER_SITE_OTHER): Payer: BC Managed Care – PPO | Admitting: Addiction (Substance Use Disorder)

## 2021-07-16 DIAGNOSIS — F4310 Post-traumatic stress disorder, unspecified: Secondary | ICD-10-CM
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
## 2021-07-28 ENCOUNTER — Other Ambulatory Visit: Payer: Self-pay

## 2021-07-28 ENCOUNTER — Ambulatory Visit (INDEPENDENT_AMBULATORY_CARE_PROVIDER_SITE_OTHER): Payer: BC Managed Care – PPO | Admitting: Addiction (Substance Use Disorder)

## 2021-07-28 DIAGNOSIS — F4310 Post-traumatic stress disorder, unspecified: Secondary | ICD-10-CM | POA: Diagnosis not present

## 2021-07-28 DIAGNOSIS — F4321 Adjustment disorder with depressed mood: Secondary | ICD-10-CM | POA: Diagnosis not present

## 2021-07-28 DIAGNOSIS — F1021 Alcohol dependence, in remission: Secondary | ICD-10-CM | POA: Diagnosis not present

## 2021-07-28 NOTE — Progress Notes (Signed)
[facility] Counselor/Therapist Progress Note  Patient ID: Paul [name], MRN: [id number],    Date: 07/28/2021  Time Spent:  Treatment Type: Individual Therapy  Reported Symptoms: saddened, hypervigilant, on edge.   Mental Status Exam:  Appearance:   Casual     Behavior:  Appropriate  Motor:  Normal  Speech/Language:   Clear and Coherent and Normal Rate  Affect:  Appropriate, Congruent, and Full Range  Mood:  anxious, labile, and sad   Thought process:  normal  Thought content:    Obsessions and Rumination  Sensory/Perceptual disturbances:    Flashback   Orientation:  x4  Attention:  Good  Concentration:  Fair  Memory:  WNL  Fund of knowledge:   Good  Insight:    Good  Judgment:   Good  Impulse Control:  Good   Risk Assessment: Danger to Self:  No  Self-injurious Behavior: No- no longer binge drinking Danger to Others: No Duty to Warn:no Physical Aggression / Violence:No  Access to Firearms a concern: No  Gang Involvement:No   Subjective: Client processed feeling extremely saddened, hypervigilant, on edge. Client shared the intense heartbreak he felt when his wife's part told him she wants to not be married and be alone. Client now is feeling paranoid and sad, like he cant kiss his wife goodbye in the morning, just hoping its not the last time he will see her. Client always hyper-aware of what could happen when he leaves but also feels better at work (which he recognizes as his new safe space where he can have structure and stability and no surprises). Client feeling helpless that anything can/will change for his wife and unsure if they will ever return to normal in their marriage. Therapist used MI and grief therapy with client to validate client's pain & normalize his reactions while also helping him to process the difficulty of dealing with the changes of his wife's mental state as he grieves the less and less time he has with his centered self wife.   Thearpsit assessed client stability and client denied SI/HI/AVH and reported no substance use but ongoing triggers. Therapist reminded client of coping skills to implement (RPT) for them.  Interventions: Cognitive Behavioral Therapy, Motivational Interviewing, Grief Therapy, and RPT    Diagnosis:   ICD-10-CM   1. PTSD (post-traumatic stress disorder)  F43.10     2. Grief reaction  F43.21     3. Alcohol use disorder, severe, in early remission, in controlled environment, dependence (HCC)  F10.21       Plan of Care: Client to return for weekly therapy with Paul [name], therapist, for outpatient therapy, to review again in 3-6 months.  Client is to consider seeing medication provider for support of mood management, triggers and cravings following stabilization of his liver.  Client to follow through with adding more support for them in their SUD txt: engaging ina form of 12 step meetings and meeting with a sponsor/ mentor for daily support/accountability.  Client to engage in SA txt and RPT relapse prevention therapy AEB coming to therapy weekly and implementing recovery oriented coping strategies to help reduce use of substances and to find relief from symptoms of MH disorders and trauma that cause them to want to escape from reality and numb their mind&body.  Client also to create more stability & structure AEB goal planning with therapist to assist in helping them get into a healthy rhythm/ schedule that can help to calm their nervous system and begin  to build more healthy brain neuropathways.  Client to engage in CBT: challenging negative internal ruminations and self-talk AEB expressing toxic thoughts and challenging them with truth.  Client to practice DBT distress tolerance skills (such as distress tolerance and emotion regulation) to practice achieving wise mind and to build support for dealing with cravings AEB learning to ride the wave of emotion/sensation/etc instead of seeking negative  self-soothing techniques: ie using substances to numb self.  Client to utilize BSP (brainspotting) with therapist to help client identify and process triggers for their MH/ SUD with goal of reducing SUDs by 33% each session.  Client to prioritize sleep 8+ hours each week night AEB going to bed by 10pm each night.   [name], LCSW, LCAS, CCTP, CCS-I, BSP

## 2021-08-06 ENCOUNTER — Ambulatory Visit: Payer: BC Managed Care – PPO | Admitting: Addiction (Substance Use Disorder)

## 2021-08-18 ENCOUNTER — Ambulatory Visit: Payer: BC Managed Care – PPO | Admitting: Addiction (Substance Use Disorder)

## 2021-08-25 ENCOUNTER — Other Ambulatory Visit: Payer: Self-pay | Admitting: Gastroenterology

## 2021-08-25 DIAGNOSIS — K7460 Unspecified cirrhosis of liver: Secondary | ICD-10-CM

## 2021-08-25 DIAGNOSIS — K8020 Calculus of gallbladder without cholecystitis without obstruction: Secondary | ICD-10-CM

## 2021-09-01 ENCOUNTER — Ambulatory Visit (INDEPENDENT_AMBULATORY_CARE_PROVIDER_SITE_OTHER): Payer: 59 | Admitting: Addiction (Substance Use Disorder)

## 2021-09-01 ENCOUNTER — Other Ambulatory Visit: Payer: Self-pay

## 2021-09-01 DIAGNOSIS — F4321 Adjustment disorder with depressed mood: Secondary | ICD-10-CM | POA: Diagnosis not present

## 2021-09-01 DIAGNOSIS — F4310 Post-traumatic stress disorder, unspecified: Secondary | ICD-10-CM
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
## 2021-09-09 ENCOUNTER — Other Ambulatory Visit: Payer: Self-pay

## 2021-09-09 ENCOUNTER — Ambulatory Visit (INDEPENDENT_AMBULATORY_CARE_PROVIDER_SITE_OTHER): Payer: 59 | Admitting: Addiction (Substance Use Disorder)

## 2021-09-09 DIAGNOSIS — F1021 Alcohol dependence, in remission: Secondary | ICD-10-CM

## 2021-09-09 DIAGNOSIS — F4310 Post-traumatic stress disorder, unspecified: Secondary | ICD-10-CM

## 2021-09-09 NOTE — Progress Notes (Signed)
[facility] Counselor/Therapist Progress Note  Patient ID: Paul [name], MRN: [id number],    Date: 09/09/2021  Time Spent:  Treatment Type: Individual Therapy  Reported Symptoms: stressed/ worried  Mental Status Exam:  Appearance:   Casual     Behavior:  Appropriate  Motor:  Normal  Speech/Language:   Clear and Coherent and Normal Rate  Affect:  Appropriate, Congruent, and Full Range  Mood:  anxious and labile   Thought process:  normal  Thought content:    Obsessions and Rumination  Sensory/Perceptual disturbances:    Flashback   Orientation:  x4  Attention:  Good  Concentration:  Fair  Memory:  WNL  Fund of knowledge:   Good  Insight:    Good  Judgment:   Good  Impulse Control:  Good   Risk Assessment: Danger to Self:  No  Self-injurious Behavior: No- no longer binge drinking Danger to Others: No Duty to Warn:no Physical Aggression / Violence:No  Access to Firearms a concern: No  Gang Involvement:No   Subjective: Client processed feeling stressed and worried he will never have his centered wife Paul [name] back due to all her trauma. Client feeling unsure and unknown, esp about knowing when his wife and all her parts are fully integrated. Client feeling afraid and stressed by these possibilities and therapist used MI & CBT to validate client's fears and stressors and help client process his thoughts and feelings. Client concerned that his wife's disability wont be renewed and she would be pushed to try to function. Client stated his wife's therapist created a new target date for her to return in a year, but client aware that his wife doesn't want to teach ever again and is afraid. Therapist assessed client stability and client denied SI/HI/AVH and reported no substance use.  Interventions: Cognitive Behavioral Therapy, Motivational Interviewing, Grief Therapy, and RPT    Diagnosis:   ICD-10-CM   1. PTSD (post-traumatic stress disorder)  F43.10     2. Alcohol  use disorder, severe, in early remission, in controlled environment, dependence (HCC)  F10.21       Plan of Care: Client to return for weekly therapy with [name], therapist, for outpatient therapy, to review again in 3-6 months.  Client is to consider seeing medication provider for support of mood management, triggers and cravings following stabilization of his liver.  Client to follow through with adding more support for them in their SUD txt: engaging ina form of 12 step meetings and meeting with a sponsor/ mentor for daily support/accountability.  Client to engage in SA txt and RPT relapse prevention therapy AEB coming to therapy weekly and implementing recovery oriented coping strategies to help reduce use of substances and to find relief from symptoms of MH disorders and trauma that cause them to want to escape from reality and numb their mind&body.  Client also to create more stability & structure AEB goal planning with therapist to assist in helping them get into a healthy rhythm/ schedule that can help to calm their nervous system and begin to build more healthy brain neuropathways.  Client to engage in CBT: challenging negative internal ruminations and self-talk AEB expressing toxic thoughts and challenging them with truth.  Client to practice DBT distress tolerance skills (such as distress tolerance and emotion regulation) to practice achieving wise mind and to build support for dealing with cravings AEB learning to ride the wave of emotion/sensation/etc instead of seeking negative self-soothing techniques: ie using substances to numb self.  Client to utilize BSP (brainspotting) with therapist to help client identify and process triggers for their MH/ SUD with goal of reducing SUDs by 33% each session.  Client to prioritize sleep 8+ hours each week night AEB going to bed by 10pm each night.   [name], LCSW, LCAS, CCTP, CCS-I, BSP

## 2021-09-16 ENCOUNTER — Ambulatory Visit (INDEPENDENT_AMBULATORY_CARE_PROVIDER_SITE_OTHER): Payer: 59 | Admitting: Addiction (Substance Use Disorder)

## 2021-09-16 ENCOUNTER — Other Ambulatory Visit: Payer: Self-pay

## 2021-09-16 DIAGNOSIS — F1021 Alcohol dependence, in remission: Secondary | ICD-10-CM

## 2021-09-16 DIAGNOSIS — F4321 Adjustment disorder with depressed mood: Secondary | ICD-10-CM | POA: Diagnosis not present

## 2021-09-16 DIAGNOSIS — F4310 Post-traumatic stress disorder, unspecified: Secondary | ICD-10-CM

## 2021-09-16 NOTE — Progress Notes (Signed)
[facility] Counselor/Therapist Progress Note  Patient ID: Paul [name], MRN: [id number],    Date: 09/16/2021  Time Spent:  Treatment Type: Individual Therapy  Reported Symptoms: hopeless- guarding against it. Hypervigilence, anxiety attacks.   Mental Status Exam:  Appearance:   Casual     Behavior:  Appropriate  Motor:  Normal  Speech/Language:   Clear and Coherent and Normal Rate  Affect:  Appropriate, Congruent, and Full Range  Mood:  anxious, depressed, and labile   Thought process:  normal  Thought content:    Obsessions and Rumination  Sensory/Perceptual disturbances:    Flashback   Orientation:  x4  Attention:  Good  Concentration:  Fair  Memory:  WNL  Fund of knowledge:   Good  Insight:    Good  Judgment:   Good  Impulse Control:  Fair   Risk Assessment: Danger to Self:  No  Self-injurious Behavior: No- no longer binge drinking Danger to Others: No Duty to Warn:no Physical Aggression / Violence:No  Access to Firearms a concern: No  Gang Involvement:No    Subjective: Client processed feeling hopeless and almost finding himself not being able to be hopeful, in the fear of losing hope again. Client therefore has been in a more hypervigilent state with consistent weariness and anxiety attacks. Client processed his learned experience with his wife's trauma thus far. Client expressed the realization of the severity of his wife's "full-blown DID diagnosis" and further discussed his fear that his wife wont ever fully integrate to one centered self. Therapist used MI & CBT with client to validate his progress in grief work to get to acceptance of this trauma and to help client process through his emotions and process more about his experience. Client feeling like hes at the end of his rope emotionally and is considering having his wife check into an inpatient unit. Client making progress acknowledging his lack of control and lack of ability to be everything  (superman) for his wife to help her get well. Therapist assessed client stability and client denied SI/HI/AVH but reported not feeling like he is able to care for himself/ his mental health. Therapist used RPT to help him safety plan for his own care. Client also reported no substance use.  Interventions: Cognitive Behavioral Therapy, Motivational Interviewing, Grief Therapy, and RPT    Diagnosis:   ICD-10-CM   1. Alcohol use disorder, severe, in early remission, in controlled environment, dependence (HCC)  F10.21     2. PTSD (post-traumatic stress disorder)  F43.10     3. Grief reaction  F43.21       Plan of Care: Client to return for weekly therapy with [name], therapist, for outpatient therapy, to review again in 3-6 months.  Client is to consider seeing medication provider for support of mood management, triggers and cravings following stabilization of his liver.  Client to follow through with adding more support for them in their SUD txt: engaging ina form of 12 step meetings and meeting with a sponsor/ mentor for daily support/accountability.  Client to engage in SA txt and RPT relapse prevention therapy AEB coming to therapy weekly and implementing recovery oriented coping strategies to help reduce use of substances and to find relief from symptoms of MH disorders and trauma that cause them to want to escape from reality and numb their mind&body.  Client also to create more stability & structure AEB goal planning with therapist to assist in helping them get into a healthy rhythm/  schedule that can help to calm their nervous system and begin to build more healthy brain neuropathways.  Client to engage in CBT: challenging negative internal ruminations and self-talk AEB expressing toxic thoughts and challenging them with truth.  Client to practice DBT distress tolerance skills (such as distress tolerance and emotion regulation) to practice achieving wise mind and to build support for  dealing with cravings AEB learning to ride the wave of emotion/sensation/etc instead of seeking negative self-soothing techniques: ie using substances to numb self.  Client to utilize BSP (brainspotting) with therapist to help client identify and process triggers for their MH/ SUD with goal of reducing SUDs by 33% each session.  Client to prioritize sleep 8+ hours each week night AEB going to bed by 10pm each night.   [name], LCSW, LCAS, CCTP, CCS-I, BSP

## 2021-09-30 ENCOUNTER — Other Ambulatory Visit: Payer: Self-pay

## 2021-09-30 ENCOUNTER — Ambulatory Visit (INDEPENDENT_AMBULATORY_CARE_PROVIDER_SITE_OTHER): Payer: 59 | Admitting: Addiction (Substance Use Disorder)

## 2021-09-30 DIAGNOSIS — F4310 Post-traumatic stress disorder, unspecified: Secondary | ICD-10-CM | POA: Diagnosis not present

## 2021-09-30 DIAGNOSIS — F1021 Alcohol dependence, in remission: Secondary | ICD-10-CM

## 2021-09-30 NOTE — Progress Notes (Signed)
[facility] Counselor/Therapist Progress Note  Patient ID: Paul [name], MRN: [id number],    Date: 09/30/2021  Time Spent:  Treatment Type: Individual Therapy  Reported Symptoms: tired, despairing.   Mental Status Exam:  Appearance:   Casual     Behavior:  Appropriate  Motor:  Normal  Speech/Language:   Clear and Coherent and Normal Rate  Affect:  Appropriate, Congruent, and Full Range  Mood:  labile and sad   Thought process:  normal  Thought content:    Obsessions and Rumination  Sensory/Perceptual disturbances:    Flashback   Orientation:  x4  Attention:  Good  Concentration:  Fair  Memory:  WNL  Fund of knowledge:   Good  Insight:    Good  Judgment:   Good  Impulse Control:  Fair   Risk Assessment: Danger to Self:  No  Self-injurious Behavior: No- no longer binge drinking Danger to Others: No Duty to Warn:no Physical Aggression / Violence:No  Access to Firearms a concern: No  Gang Involvement:No    Subjective: Client processed feeling like he is a frog waking up in the boiling water, realizing he is still in the middle of the mess/etc. Client expressed his continued frustration with being in the middle of the mess and feeling worn. Client processed his feeling worn and therapist used MI & CBT to validate client's exhaustion and to help him process his despairing emotions. Therapist also helped client use mindfulness to help ground and help him feel more restored. Therapist used RPT to help him safety plan for his own care. Therapist assessed client stability and client denied SI/HI/AVH. Client reported no substance use or cravings.   Interventions: Cognitive Behavioral Therapy, Mindfulness Meditation, Motivational Interviewing, and RPT    Diagnosis: No diagnosis found.   Plan of Care: Client to return for weekly therapy with [name], therapist, for outpatient therapy, to review again in 3-6 months.  Client is to consider seeing medication  provider for support of mood management, triggers and cravings following stabilization of his liver.  Client to follow through with adding more support for them in their SUD txt: engaging ina form of 12 step meetings and meeting with a sponsor/ mentor for daily support/accountability.  Client to engage in SA txt and RPT relapse prevention therapy AEB coming to therapy weekly and implementing recovery oriented coping strategies to help reduce use of substances and to find relief from symptoms of MH disorders and trauma that cause them to want to escape from reality and numb their mind&body.  Client also to create more stability & structure AEB goal planning with therapist to assist in helping them get into a healthy rhythm/ schedule that can help to calm their nervous system and begin to build more healthy brain neuropathways.  Client to engage in CBT: challenging negative internal ruminations and self-talk AEB expressing toxic thoughts and challenging them with truth.  Client to practice DBT distress tolerance skills (such as distress tolerance and emotion regulation) to practice achieving wise mind and to build support for dealing with cravings AEB learning to ride the wave of emotion/sensation/etc instead of seeking negative self-soothing techniques: ie using substances to numb self.  Client to utilize BSP (brainspotting) with therapist to help client identify and process triggers for their MH/ SUD with goal of reducing SUDs by 33% each session.  Client to prioritize sleep 8+ hours each week night AEB going to bed by 10pm each night.   [name], LCSW, LCAS,  CCTP, CCS-I, BSP

## 2021-10-14 ENCOUNTER — Ambulatory Visit: Payer: BC Managed Care – PPO | Admitting: Addiction (Substance Use Disorder)

## 2021-10-28 ENCOUNTER — Ambulatory Visit: Payer: BC Managed Care – PPO | Admitting: Addiction (Substance Use Disorder)

## 2021-12-03 ENCOUNTER — Ambulatory Visit (INDEPENDENT_AMBULATORY_CARE_PROVIDER_SITE_OTHER): Payer: No Typology Code available for payment source | Admitting: Addiction (Substance Use Disorder)

## 2021-12-03 DIAGNOSIS — F4321 Adjustment disorder with depressed mood: Secondary | ICD-10-CM

## 2021-12-03 NOTE — Progress Notes (Signed)
?    [facility] Counselor/Therapist Progress Note ? ?Patient ID: Codie Finigan, MRN: [id number],   ? ?Date: 12/03/2021 ? ?Time Spent: 73mins ? ?Treatment Type: Individual Therapy ? ?Reported Symptoms: content, hopeful ? ?Mental Status Exam: ? ?Appearance:   Casual     ?Behavior:  Appropriate  ?Motor:  Normal  ?Speech/Language:   Clear and Coherent and Normal Rate  ?Affect:  Appropriate, Congruent, and Full Range  ?Mood:  normal   ?Thought process:  normal  ?Thought content:    Obsessions and Rumination  ?Sensory/Perceptual disturbances:    WNL   ?Orientation:  x4  ?Attention:  Good  ?Concentration:  Good  ?Memory:  WNL  ?Fund of knowledge:   Good  ?Insight:    Good  ?Judgment:   Good  ?Impulse Control:  Good  ? ?Risk Assessment: ?Danger to Self:  No  ?Self-injurious Behavior: No- no longer binge drinking ?Danger to Others: No ?Duty to Warn:no ?Physical Aggression / Violence:No  ?Access to Firearms a concern: No  ?Gang Involvement:No  ? ? ?Subjective: Client processed feeling content and overall more happy feeling due to his change in thinking.  Client reports trying not to focus on the negative things occurring with his wife and even considering not coming to therapy for a persiod of time if things are continuing to go okay. Client reported his wife is continuing to deal with multiple parts but he is no longer feeling out of control dealing with it and no longer being as triggered by it all. Therapist used MI to affirm his progress and affirm his strength in regulating despite his wife's lack of mental improvement. Client processed his feelings seeing his wife's part not be present and the grief surrounding that; therapist used CBT & grief therapy to support client in processing his grief and current feelings. Therapist assessed client stability and client denied SI/HI/AVH. Client reported no substance use or cravings.  ? ?Interventions: Cognitive Behavioral Therapy, Motivational Interviewing, Grief Therapy, and  RPT   ? ?Diagnosis: ?  ICD-10-CM   ?1. Grief reaction  F43.21   ?  ? ? ?Plan of Care: ?Client to return for weekly therapy with Sammuel Cooper, therapist, for outpatient therapy, to review again in 3-6 months.  ?Client is to consider seeing medication provider for support of mood management, triggers and cravings following stabilization of his liver.  ?Client to follow through with adding more support for them in their SUD txt: engaging ina form of 12 step meetings and meeting with a sponsor/ mentor for daily support/accountability.  ?Client to engage in [place] txt and RPT relapse prevention therapy AEB coming to therapy weekly and implementing recovery oriented coping strategies to help reduce use of substances and to find relief from symptoms of MH disorders and trauma that cause them to want to escape from reality and numb their mind&body.  ?Client also to create more stability & structure AEB goal planning with therapist to assist in helping them get into a healthy rhythm/ schedule that can help to calm their nervous system and begin to build more healthy brain neuropathways.  ?Client to engage in CBT: challenging negative internal ruminations and self-talk AEB expressing toxic thoughts and challenging them with truth.  ?Client to practice DBT distress tolerance skills (such as distress tolerance and emotion regulation) to practice achieving wise mind and to build support for dealing with cravings AEB learning to ride the wave of emotion/sensation/etc instead of seeking negative self-soothing techniques: ie using substances to numb self.  ?Client  to utilize BSP (brainspotting) with therapist to help client identify and process triggers for their MH/ SUD with goal of reducing SUDs by 33% each session.  ?Client to prioritize sleep 8+ hours each week night AEB going to bed by 10pm each night.  ? ?Barnie Del, LCSW, LCAS, CCTP, CCS-I, BSP ? ? ? ? ? ? ? ? ? ? ? ? ? ?

## 2021-12-15 ENCOUNTER — Ambulatory Visit (INDEPENDENT_AMBULATORY_CARE_PROVIDER_SITE_OTHER): Payer: No Typology Code available for payment source | Admitting: Addiction (Substance Use Disorder)

## 2021-12-15 DIAGNOSIS — F4321 Adjustment disorder with depressed mood: Secondary | ICD-10-CM | POA: Diagnosis not present

## 2021-12-15 NOTE — Progress Notes (Signed)
?    [facility] Counselor/Therapist Progress Note ? ?Patient ID: Paul [name], MRN: [id number],   ? ?Date: 12/15/2021 ? ?Time Spent: ? ?Treatment Type: Individual Therapy ? ?Reported Symptoms: shocked, scared ? ?Mental Status Exam: ? ?Appearance:   Disheveled     ?Behavior:  Appropriate  ?Motor:  Normal  ?Speech/Language:   Clear and Coherent and Normal Rate  ?Affect:  Appropriate, Congruent, Full Range, and Tearful  ?Mood:  anxious, labile, and sad   ?Thought process:  normal  ?Thought content:    Obsessions and Rumination  ?Sensory/Perceptual disturbances:    WNL   ?Orientation:  x4  ?Attention:  Good  ?Concentration:  Good  ?Memory:  WNL  ?Fund of knowledge:   Good  ?Insight:    Good  ?Judgment:   Good  ?Impulse Control:  Good  ? ?Risk Assessment: ?Danger to Self:  No  ?Self-injurious Behavior: No- no longer binge drinking ?Danger to Others: No ?Duty to Warn:no ?Physical Aggression / Violence:No  ?Access to Firearms a concern: No  ?Gang Involvement:No  ? ? ?Subjective: Client processed his wife's attempt to take her life after a trauma appt with her therapist and his utter shock with how much things changed in one day. Client processed his attempts at self care as his wife is being moved to inpatient from the ER following the attempt. Therapist assessed for client safety also and sobriety; Client reported no thoughts of drinking and denied SI/HI/AVH. Client reported feeling even more in need of being stable and on guard, waiting for his wife to be discharged in the next week. Client preparing his life and discussing with client's therapist the safety measures that need to be in place for her to continue getting trauma treatment and to help her stay safe. Client returning on Thursday for another appt for him to continue feeling supported and not alone walking through this grief. Therapist used MI& grief therapy to validate client's feelings/reactions and to support client in processing his grief and the  shock of this event.  ? ?Interventions: Cognitive Behavioral Therapy, Motivational Interviewing, Grief Therapy, and RPT   ? ?Diagnosis: ?  ICD-10-CM   ?1. Grief reaction  F43.21   ?  ? ? ? ?Plan of Care: ?Client to return for weekly therapy with [name], therapist, for outpatient therapy, to review again in 3-6 months.  ?Client is to consider seeing medication provider for support of mood management, triggers and cravings following stabilization of his liver.  ?Client to follow through with adding more support for them in their SUD txt: engaging ina form of 12 step meetings and meeting with a sponsor/ mentor for daily support/accountability.  ?Client to engage in SA txt and RPT relapse prevention therapy AEB coming to therapy weekly and implementing recovery oriented coping strategies to help reduce use of substances and to find relief from symptoms of MH disorders and trauma that cause them to want to escape from reality and numb their mind&body.  ?Client also to create more stability & structure AEB goal planning with therapist to assist in helping them get into a healthy rhythm/ schedule that can help to calm their nervous system and begin to build more healthy brain neuropathways.  ?Client to engage in CBT: challenging negative internal ruminations and self-talk AEB expressing toxic thoughts and challenging them with truth.  ?Client to practice DBT distress tolerance skills (such as distress tolerance and emotion regulation) to practice achieving wise mind and to build support for dealing with cravings AEB  learning to ride the wave of emotion/sensation/etc instead of seeking negative self-soothing techniques: ie using substances to numb self.  ?Client to utilize BSP (brainspotting) with therapist to help client identify and process triggers for their MH/ SUD with goal of reducing SUDs by 33% each session.  ?Client to prioritize sleep 8+ hours each week night AEB going to bed by 10pm each night.  ? ?[name], LCSW, LCAS, CCTP, CCS-I, BSP ? ? ? ? ? ? ? ? ? ? ? ? ? ?

## 2021-12-17 ENCOUNTER — Ambulatory Visit (INDEPENDENT_AMBULATORY_CARE_PROVIDER_SITE_OTHER): Payer: No Typology Code available for payment source | Admitting: Addiction (Substance Use Disorder)

## 2021-12-17 DIAGNOSIS — F4310 Post-traumatic stress disorder, unspecified: Secondary | ICD-10-CM

## 2021-12-17 NOTE — Progress Notes (Signed)
?    [facility] Counselor/Therapist Progress Note ? ?Patient ID: Paul [name], MRN: [id number],   ? ?Date: 12/17/2021 ? ?Time Spent: ? ?Treatment Type: Individual Therapy ? ?Reported Symptoms: shocked, scared ? ?Mental Status Exam: ? ?Appearance:   Disheveled     ?Behavior:  Appropriate  ?Motor:  Normal  ?Speech/Language:   Clear and Coherent and Normal Rate  ?Affect:  Appropriate, Congruent, Full Range, and Tearful  ?Mood:  anxious, labile, and sad   ?Thought process:  normal  ?Thought content:    Obsessions and Rumination  ?Sensory/Perceptual disturbances:    WNL   ?Orientation:  x4  ?Attention:  Good  ?Concentration:  Good  ?Memory:  WNL  ?Fund of knowledge:   Good  ?Insight:    Good  ?Judgment:   Good  ?Impulse Control:  Good  ? ?Risk Assessment: ?Danger to Self:  No  ?Self-injurious Behavior: No- no longer binge drinking ?Danger to Others: No ?Duty to Warn:no ?Physical Aggression / Violence:No  ?Access to Firearms a concern: No  ?Gang Involvement:No  ? ? ?Subjective: Client processed how he is worrying about the return of his wife and her safety. Client processed his questions about his ability to try to keep her safe and therapist used MI with client to validate the belief that nothing he can do will keep her 100% safe. Therapist also worked with client to help him come up with a safety plan for himself and his wife when she comes home from the hospital tomorrow. Client made progress discussing a plan to ask for help from his 2 sons and his wifes 2 closest friends to sit with his wife if he needs a break while she starts to readjust to being out of the hospital. Therapist also used mindfulness with client to help him ground and find his breath. Therapist assessed for client safety also and sobriety; Client reported no thoughts of drinking and denied SI/HI/AVH. Client grieved as the shock of his wife's suicide attempt wore off & therapist used grief therapy to support him as he processed.   ? ?Interventions: Cognitive Behavioral Therapy, Motivational Interviewing, Grief Therapy, and RPT   ? ?Diagnosis: ?  ICD-10-CM   ?1. PTSD (post-traumatic stress disorder)  F43.10   ?  ? ? ?Plan of Care: ?Client to return for weekly therapy with [name], therapist, for outpatient therapy, to review again in 3-6 months.  ?Client is to consider seeing medication provider for support of mood management, triggers and cravings following stabilization of his liver.  ?Client to follow through with adding more support for them in their SUD txt: engaging ina form of 12 step meetings and meeting with a sponsor/ mentor for daily support/accountability.  ?Client to engage in SA txt and RPT relapse prevention therapy AEB coming to therapy weekly and implementing recovery oriented coping strategies to help reduce use of substances and to find relief from symptoms of MH disorders and trauma that cause them to want to escape from reality and numb their mind&body.  ?Client also to create more stability & structure AEB goal planning with therapist to assist in helping them get into a healthy rhythm/ schedule that can help to calm their nervous system and begin to build more healthy brain neuropathways.  ?Client to engage in CBT: challenging negative internal ruminations and self-talk AEB expressing toxic thoughts and challenging them with truth.  ?Client to practice DBT distress tolerance skills (such as distress tolerance and emotion regulation) to practice achieving wise mind and to  build support for dealing with cravings AEB learning to ride the wave of emotion/sensation/etc instead of seeking negative self-soothing techniques: ie using substances to numb self.  ?Client to utilize BSP (brainspotting) with therapist to help client identify and process triggers for their MH/ SUD with goal of reducing SUDs by 33% each session.  ?Client to prioritize sleep 8+ hours each week night AEB going to bed by 10pm each night.  ? ?[name], LCSW, LCAS, CCTP, CCS-I, BSP ? ? ? ? ? ? ? ? ? ? ? ? ? ?

## 2021-12-29 ENCOUNTER — Ambulatory Visit (INDEPENDENT_AMBULATORY_CARE_PROVIDER_SITE_OTHER): Payer: No Typology Code available for payment source | Admitting: Addiction (Substance Use Disorder)

## 2021-12-29 DIAGNOSIS — F4321 Adjustment disorder with depressed mood: Secondary | ICD-10-CM

## 2021-12-29 DIAGNOSIS — F4310 Post-traumatic stress disorder, unspecified: Secondary | ICD-10-CM

## 2021-12-29 NOTE — Progress Notes (Signed)
?    [facility] Counselor/Therapist Progress Note ? ?Patient ID: Abdurahman Rugg, MRN: [id number],   ? ?Date: 12/29/2021 ? ?Time Spent: ? ?Treatment Type: Individual Therapy ? ?Reported Symptoms: exhausted, trying to find hope ? ?Mental Status Exam: ? ?Appearance:   Casual     ?Behavior:  Appropriate  ?Motor:  Normal  ?Speech/Language:   Clear and Coherent and Normal Rate  ?Affect:  Appropriate, Congruent, Full Range, and Tearful  ?Mood:  anxious, labile, and sad   ?Thought process:  normal  ?Thought content:    Obsessions and Rumination  ?Sensory/Perceptual disturbances:    WNL   ?Orientation:  x4  ?Attention:  Good  ?Concentration:  Good  ?Memory:  WNL  ?Fund of knowledge:   Good  ?Insight:    Good  ?Judgment:   Good  ?Impulse Control:  Good  ? ?Risk Assessment: ?Danger to Self:  No  ?Self-injurious Behavior: No- no longer binge drinking ?Danger to Others: No ?Duty to Warn:no ?Physical Aggression / Violence:No  ?Access to Firearms a concern: No  ?Gang Involvement:No  ? ?Subjective: Client processed being exhausted sine his wife's return from the hospital. Client attempting to look for hope in his wife's recovery and trying to be hopeful that she can recover and possibly never again have a suicide attempt. Client plans to continue getting support from a DID specialist for validation of what he is living through with his wife with DID. Client reported needing to slow his research about DID to allow him to be human and his wife's husband. Client trying to find a way to regulate without drinking. Therapist also used grief therapy to normalize clients response to the grief of having his wife attempt to take her life. Client reported some temptation about drinking and therapist used RPT with client to help him cope with the cravings and MI to support him in processing the pain he is enduring. Therapist assessed for client safety also and sobriety; Client reported no drinking and denied SI/HI/AVH.   ? ?Interventions: Cognitive Behavioral Therapy, Motivational Interviewing, Grief Therapy, and RPT   ? ?Diagnosis: ?  ICD-10-CM   ?1. PTSD (post-traumatic stress disorder)  F43.10   ?  ?2. Grief reaction  F43.21   ?  ? ? ?Plan of Care: ?Client to continue getting support from a DID support person for validation of what he is going through.  ?Client to return for weekly therapy with [name], therapist, for outpatient therapy, to review again in 3-6 months.  ?Client is to consider seeing medication provider for support of mood management, triggers and cravings following stabilization of his liver.  ?Client to follow through with adding more support for them in their SUD txt: engaging ina form of 12 step meetings and meeting with a sponsor/ mentor for daily support/accountability.  ?Client to engage in SA txt and RPT relapse prevention therapy AEB coming to therapy weekly and implementing recovery oriented coping strategies to help reduce use of substances and to find relief from symptoms of MH disorders and trauma that cause them to want to escape from reality and numb their mind&body.  ?Client also to create more stability & structure AEB goal planning with therapist to assist in helping them get into a healthy rhythm/ schedule that can help to calm their nervous system and begin to build more healthy brain neuropathways.  ?Client to engage in CBT: challenging negative internal ruminations and self-talk AEB expressing toxic thoughts and challenging them with truth.  ?Client to practice DBT  distress tolerance skills (such as distress tolerance and emotion regulation) to practice achieving wise mind and to build support for dealing with cravings AEB learning to ride the wave of emotion/sensation/etc instead of seeking negative self-soothing techniques: ie using substances to numb self.  ?Client to utilize BSP (brainspotting) with therapist to help client identify and process triggers for their MH/ SUD with goal of  reducing SUDs by 33% each session.  ?Client to prioritize sleep 8+ hours each week night AEB going to bed by 10pm each night.  ? ?[name], LCSW, LCAS, CCTP, CCS-I, BSP ? ? ? ? ? ? ? ? ? ? ? ? ? ?

## 2022-01-13 ENCOUNTER — Ambulatory Visit (INDEPENDENT_AMBULATORY_CARE_PROVIDER_SITE_OTHER): Payer: No Typology Code available for payment source | Admitting: Addiction (Substance Use Disorder)

## 2022-01-13 DIAGNOSIS — F4310 Post-traumatic stress disorder, unspecified: Secondary | ICD-10-CM | POA: Diagnosis not present

## 2022-01-13 NOTE — Progress Notes (Signed)
[facility] Counselor/Therapist Progress Note  Patient ID: Paul [name], MRN: [id number],    Date: 01/13/2022  Time Spent: 54 mins  Treatment Type: Individual Therapy  Reported Symptoms: anxious, not feeling sure at all, feeling sad/alone.   Mental Status Exam:  Appearance:   Casual     Behavior:  Appropriate  Motor:  Normal  Speech/Language:   Clear and Coherent and Normal Rate  Affect:  Appropriate, Congruent, and Full Range  Mood:  anxious and sad   Thought process:  normal  Thought content:    Rumination  Sensory/Perceptual disturbances:    WNL   Orientation:  x4  Attention:  Good  Concentration:  Good  Memory:  WNL  Fund of knowledge:   Good  Insight:    Good  Judgment:   Good  Impulse Control:  Good   Risk Assessment: Danger to Self:  No  Self-injurious Behavior: No Danger to Others: No Duty to Warn:no Physical Aggression / Violence:No  Access to Firearms a concern: No  Gang Involvement:No   Subjective: Client processed feeling anxious and not feeling sure about anything with his wife at all. Client craving structure and normalcy in the midst of the distress his wife is enduring. Client processed his attempts to create feelings of safety internally and his inability to. Due to that, client feeling isolated and alone. Therapist used CBT to help client process his feelings, and mindfulness to help client regulate. Client unsure of his wife's progress and due to that, in a state of hypervigilence or uncertainty. Therapist used MI & psycho-ed to normalize his response to his wife's crisis/ trauma of attempting suicide. Therapist assessed for client safety also and sobriety; Client reported no drinking/cravings and denied SI/HI/AVH.   Interventions: Cognitive Behavioral Therapy, Mindfulness Meditation, Motivational Interviewing, Grief Therapy, Psycho-education/Bibliotherapy, and RPT    Diagnosis:   ICD-10-CM   1. PTSD (post-traumatic stress disorder)  F43.10        Plan of Care: Client to continue getting support from a DID support person for validation of what he is going through.  Client to return for weekly therapy with [name], therapist, for outpatient therapy, to review again in 3-6 months.  Client is to consider seeing medication provider for support of mood management, triggers and cravings following stabilization of his liver.  Client to follow through with adding more support for them in their SUD txt: engaging ina form of 12 step meetings and meeting with a sponsor/ mentor for daily support/accountability.  Client to engage in SA txt and RPT relapse prevention therapy AEB coming to therapy weekly and implementing recovery oriented coping strategies to help reduce use of substances and to find relief from symptoms of MH disorders and trauma that cause them to want to escape from reality and numb their mind&body.  Client also to create more stability & structure AEB goal planning with therapist to assist in helping them get into a healthy rhythm/ schedule that can help to calm their nervous system and begin to build more healthy brain neuropathways.  Client to engage in CBT: challenging negative internal ruminations and self-talk AEB expressing toxic thoughts and challenging them with truth.  Client to practice DBT distress tolerance skills (such as distress tolerance and emotion regulation) to practice achieving wise mind and to build support for dealing with cravings AEB learning to ride the wave of emotion/sensation/etc instead of seeking negative self-soothing techniques: ie using substances to numb self.  Client to utilize BSP (brainspotting) with  therapist to help client identify and process triggers for their MH/ SUD with goal of reducing SUDs by 33% each session Client to regulate emotionally to create internal safety. Progress: Client reported minimal progress of his ability to create feelings of personal internal safety.  [name],  LCSW, LCAS, CCTP, CCS-I, BSP

## 2022-01-19 ENCOUNTER — Ambulatory Visit (INDEPENDENT_AMBULATORY_CARE_PROVIDER_SITE_OTHER): Payer: No Typology Code available for payment source | Admitting: Addiction (Substance Use Disorder)

## 2022-01-19 DIAGNOSIS — F4310 Post-traumatic stress disorder, unspecified: Secondary | ICD-10-CM | POA: Diagnosis not present

## 2022-01-19 NOTE — Progress Notes (Signed)
[facility] Counselor/Therapist Progress Note  Patient ID: Paul [name], MRN: [id number],    Date: 01/19/2022  Time Spent:  Treatment Type: Individual Therapy  Reported Symptoms: work struggles, hypervigilant  Mental Status Exam:  Appearance:   Casual     Behavior:  Appropriate  Motor:  Normal  Speech/Language:   Clear and Coherent and Normal Rate  Affect:  Appropriate, Congruent, and Full Range  Mood:  anxious   Thought process:  normal  Thought content:    Rumination  Sensory/Perceptual disturbances:    WNL   Orientation:  x4  Attention:  Good  Concentration:  Good  Memory:  WNL  Fund of knowledge:   Good  Insight:    Good  Judgment:   Good  Impulse Control:  Good   Risk Assessment: Danger to Self:  No  Self-injurious Behavior: No Danger to Others: No Duty to Warn:no Physical Aggression / Violence:No  Access to Firearms a concern: No  Gang Involvement:No   Subjective: Client unable to find a sense of calm in his body. Therapist worked to ground client emotionally by processing his feelings and MI to normalize his response to the trauma. Therapist also used mindfulness to help client physically find a sense of quiet internally. Client made minimal progress, reporting only a reduction of SUDS from a 10/10 to a 8/10. Client reported anticapatory grief as he anxiously waits for his wife to heal, also aware that if her mental state worsens, she could also attempt again to take her life. Therapist used CBT to process client's feelings about this fear. Therapist assessed for client safety also and sobriety; Client reported no drinking/cravings and denied SI/HI/AVH.   Interventions: Cognitive Behavioral Therapy, Mindfulness Meditation, Motivational Interviewing, Psycho-education/Bibliotherapy, and RPT    Diagnosis:   ICD-10-CM   1. PTSD (post-traumatic stress disorder)  F43.10       Plan of Care: Client to continue getting support from a DID support person  for validation of what he is going through.  Client to return for weekly therapy with [name], therapist, for outpatient therapy, to review again in 3-6 months.  Client is to consider seeing medication provider for support of mood management, triggers and cravings following stabilization of his liver.  Client to follow through with adding more support for them in their SUD txt: engaging ina form of 12 step meetings and meeting with a sponsor/ mentor for daily support/accountability.  Client to engage in SA txt and RPT relapse prevention therapy AEB coming to therapy weekly and implementing recovery oriented coping strategies to help reduce use of substances and to find relief from symptoms of MH disorders and trauma that cause them to want to escape from reality and numb their mind&body.  Client also to create more stability & structure AEB goal planning with therapist to assist in helping them get into a healthy rhythm/ schedule that can help to calm their nervous system and begin to build more healthy brain neuropathways.  Client to engage in CBT: challenging negative internal ruminations and self-talk AEB expressing toxic thoughts and challenging them with truth.  Client to practice DBT distress tolerance skills (such as distress tolerance and emotion regulation) to practice achieving wise mind and to build support for dealing with cravings AEB learning to ride the wave of emotion/sensation/etc instead of seeking negative self-soothing techniques: ie using substances to numb self.  Client to utilize BSP (brainspotting) with therapist to help client identify and process triggers for their MH/ SUD  with goal of reducing SUDs by 33% each session Client to regulate emotionally to create internal safety. Progress: Client also trying to reduce hypervigilance with little progress due to the trauma still being present in his life and still occurring to his wife.  Client reported minimal progress of his  ability to create feelings of personal internal safety.   [name], LCSW, LCAS, CCTP, CCS-I, BSP

## 2022-01-26 ENCOUNTER — Other Ambulatory Visit: Payer: Self-pay | Admitting: Gastroenterology

## 2022-01-26 DIAGNOSIS — K7030 Alcoholic cirrhosis of liver without ascites: Secondary | ICD-10-CM

## 2022-01-26 DIAGNOSIS — K7010 Alcoholic hepatitis without ascites: Secondary | ICD-10-CM

## 2022-01-29 ENCOUNTER — Ambulatory Visit
Admission: RE | Admit: 2022-01-29 | Discharge: 2022-01-29 | Disposition: A | Discharge status: Home / Self Care | Payer: No Typology Code available for payment source | Source: Ambulatory Visit | Attending: Gastroenterology | Admitting: Gastroenterology

## 2022-01-29 DIAGNOSIS — K7010 Alcoholic hepatitis without ascites: Secondary | ICD-10-CM

## 2022-01-29 DIAGNOSIS — K7030 Alcoholic cirrhosis of liver without ascites: Secondary | ICD-10-CM

## 2022-02-02 ENCOUNTER — Ambulatory Visit (INDEPENDENT_AMBULATORY_CARE_PROVIDER_SITE_OTHER): Payer: No Typology Code available for payment source | Admitting: Addiction (Substance Use Disorder)

## 2022-02-02 DIAGNOSIS — F4310 Post-traumatic stress disorder, unspecified: Secondary | ICD-10-CM
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
## 2022-02-23 ENCOUNTER — Ambulatory Visit: Payer: No Typology Code available for payment source | Admitting: Addiction (Substance Use Disorder)

## 2022-03-04 ENCOUNTER — Ambulatory Visit (INDEPENDENT_AMBULATORY_CARE_PROVIDER_SITE_OTHER): Payer: No Typology Code available for payment source | Admitting: Addiction (Substance Use Disorder)

## 2022-03-04 DIAGNOSIS — F4321 Adjustment disorder with depressed mood: Secondary | ICD-10-CM

## 2022-03-04 NOTE — Progress Notes (Signed)
[facility] Counselor/Therapist Progress Note  Patient ID: Paul [name], MRN: [id number],    Date: 03/04/2022  Time Spent:  Treatment Type: Individual Therapy  Reported Symptoms: in survival mode, exhausted.   Mental Status Exam:  Appearance:   Casual     Behavior:  Appropriate  Motor:  Normal  Speech/Language:   Clear and Coherent  Affect:  Congruent and Full Range  Mood:  anxious, decreased range, sad, and worn down    Thought process:  normal  Thought content:    Rumination  Sensory/Perceptual disturbances:    WNL   Orientation:  x4  Attention:  Good  Concentration:  Good  Memory:  WNL  Fund of knowledge:   Good  Insight:    Good  Judgment:   Good  Impulse Control:  Good   Risk Assessment: Danger to Self:  No  Self-injurious Behavior: No Danger to Others: No Duty to Warn:no Physical Aggression / Violence:No  Access to Firearms a concern: No  Gang Involvement:No   Subjective: Client shared about his most recent month with dealing with his wife's disassociation due to her childhood trauma. Client expressed how lonely it is being alone to be the adult with what feels like a group of 8year olds who are his wife's internal parts that are being affected. Client processed his feelings and therapist used MI& validate them and CBT to help support client in processing more of his thoughts about disassociation due to trauma. Client talked about the ongoing grief of losing the woman he felt he married to these "20 year old kids" and therapist used Grief therapy to support him in further processing that rollercoaster. Therapist assessed for client safety also and sobriety; Client reported no drinking/cravings and denied SI/HI/AVH.  Interventions: Cognitive Behavioral Therapy, Motivational Interviewing, Grief Therapy, and RPT    Diagnosis:   ICD-10-CM   1. Grief reaction  F43.21       Plan of Care: Client to continue getting support from a DID support person for  validation of what he is going through.  Client to return for weekly therapy with Paul [name], therapist, for outpatient therapy, to review again in 3-6 months.  Client is to consider seeing medication provider for support of mood management, triggers and cravings following stabilization of his liver.  Client to follow through with adding more support for them in their SUD txt: engaging ina form of 12 step meetings and meeting with a sponsor/ mentor for daily support/accountability.  Client to engage in SA txt and RPT relapse prevention therapy AEB coming to therapy weekly and implementing recovery oriented coping strategies to help reduce use of substances and to find relief from symptoms of MH disorders and trauma that cause them to want to escape from reality and numb their mind&body.  Client also to create more stability & structure AEB goal planning with therapist to assist in helping them get into a healthy rhythm/ schedule that can help to calm their nervous system and begin to build more healthy brain neuropathways.  Client to engage in CBT: challenging negative internal ruminations and self-talk AEB expressing toxic thoughts and challenging them with truth.  Client to practice DBT distress tolerance skills (such as distress tolerance and emotion regulation) to practice achieving wise mind and to build support for dealing with cravings AEB learning to ride the wave of emotion/sensation/etc instead of seeking negative self-soothing techniques: ie using substances to numb self.  Client to utilize BSP (brainspotting) with therapist to  help client identify and process triggers for their MH/ SUD with goal of reducing SUDs by 33% each session Client to regulate emotionally to create internal safety. Progress: Client grieving and trying to accept his wife's emotional state, despite his fear and frustration.  Client also trying to reduce hypervigilance with little progress due to the trauma still being  present in his life and still occurring to his wife.  Client reported minimal progress of his ability to create feelings of personal internal safety.   [name], LCSW, LCAS, CCTP, CCS-I, BSP

## 2022-03-17 ENCOUNTER — Ambulatory Visit: Payer: No Typology Code available for payment source | Admitting: Addiction (Substance Use Disorder)

## 2022-03-31 ENCOUNTER — Ambulatory Visit: Payer: No Typology Code available for payment source | Admitting: Addiction (Substance Use Disorder)

## 2022-04-01 IMAGING — MR MR ABDOMEN WO/W CM MRCP
15 of 24 series · 20 of 48 positions shown · IV contrast (gadavist)
Comparison: Ultrasound examination 08/20/2020

CLINICAL DATA: Elevated liver function studies, hyperbilirubinemia
and right upper quadrant abdominal pain.

EXAM:
MRI ABDOMEN WITHOUT AND WITH CONTRAST (INCLUDING MRCP)
TECHNIQUE: Multiplanar multisequence MR imaging of the abdomen was performed
both before and after the administration of intravenous contrast.
Heavily T2-weighted images of the biliary and pancreatic ducts were
obtained, and three-dimensional MRCP images were rendered by post
processing.
CONTRAST:  7mL GADAVIST GADOBUTROL 1 MMOL/ML IV SOLN

[Series 4: cor ssfse nav · coronal · 6.0mm · 0.78mm/px · 1 of 40 slices shown]
[im 1/40]
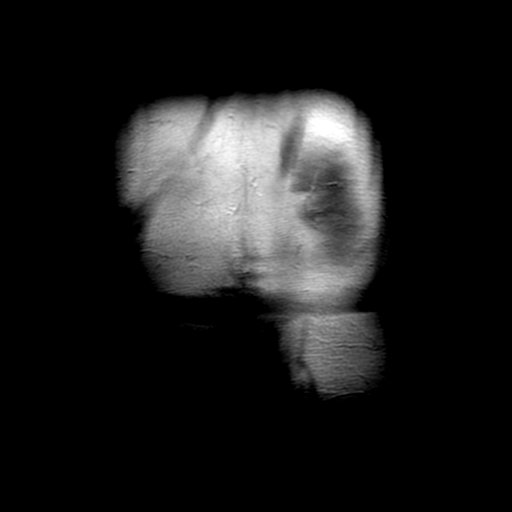

[Series 5: ax ssfse nav · axial · 6.0mm · 0.78mm/px · 1 of 50 slices shown]
[im 1/50]
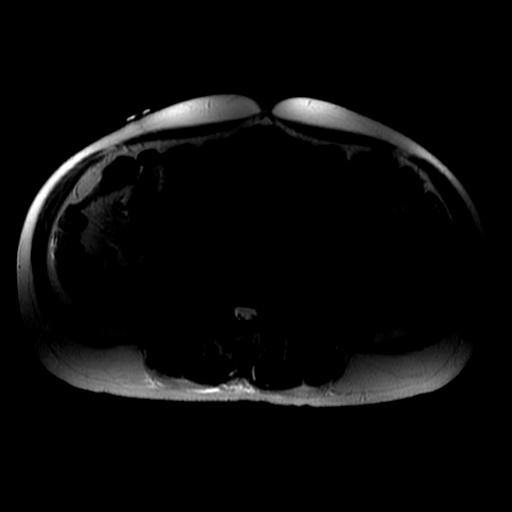

[Series 7: DWI b500 · axial · 8.0mm · 1.56mm/px · 1 of 64 slices shown]
[im 1/64]
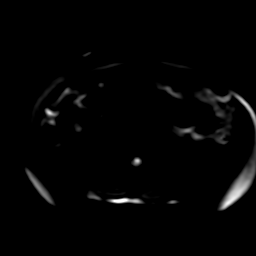

[Series 8: T2 fat-sat · axial · 6.0mm · 0.78mm/px · 1 of 50 slices shown]
[im 1/50]
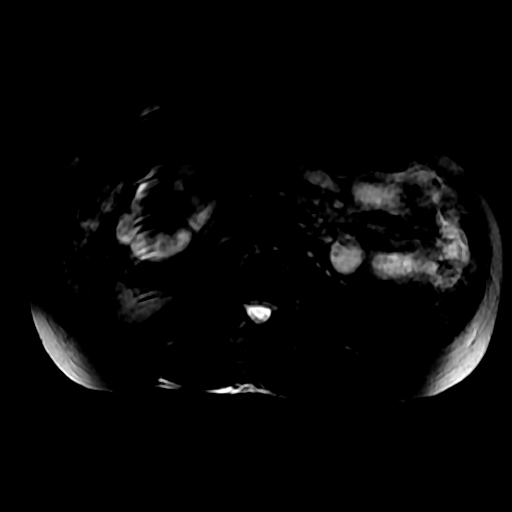

[Series 11: radial 2d thick · coronal · 40.0mm · 0.86mm/px · 1 of 6 slices shown]
[im 1/6]
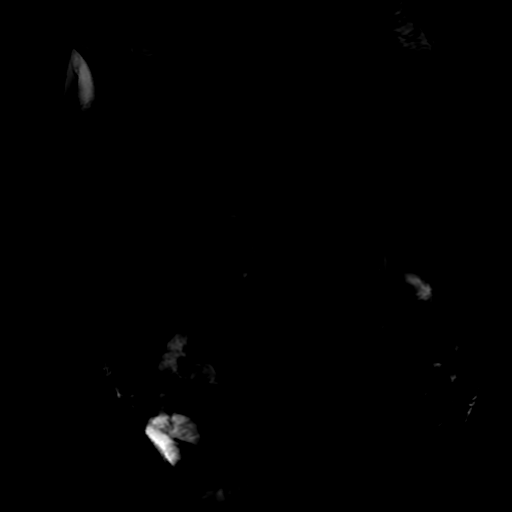

[Series 12: bSSFP · coronal · 6.0mm · 0.78mm/px · 1 of 39 slices shown]
[im 1/39]
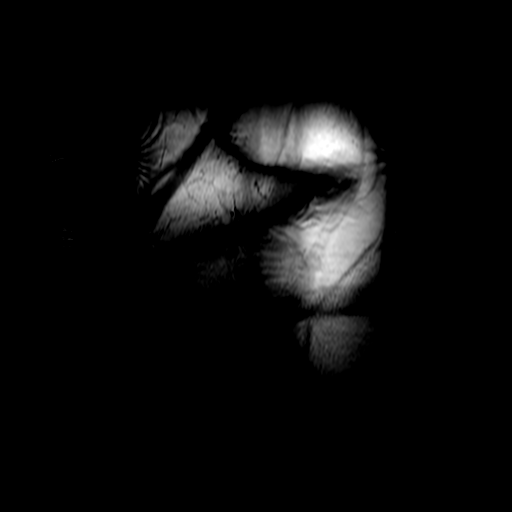

[Series 14: T1 dynamic · coronal · 3.4mm · 1.56mm/px · 2 of 138 slices shown (1 of 5)]
[im 1/138]
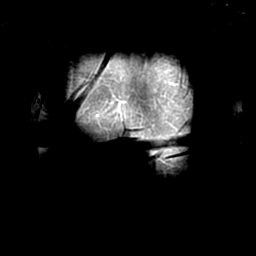
[im 138/138]
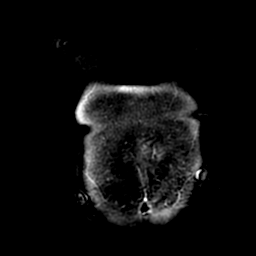

[Series 650: projection images · axial · 1.2mm · 0.63mm/px · 1 of 19 slices shown (1 of 2)]
[im 1/19]
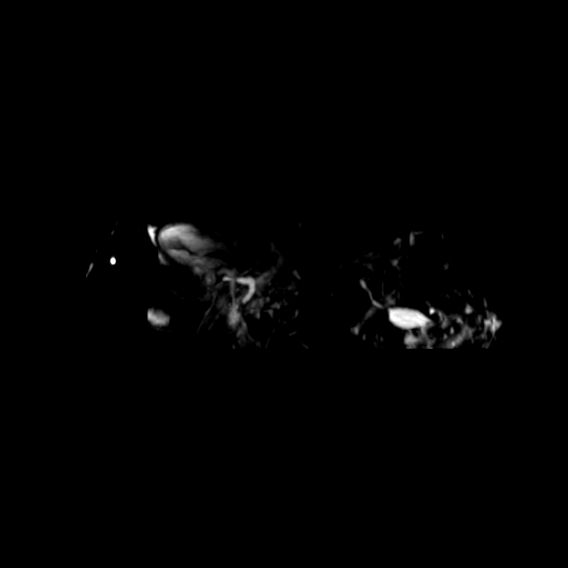

[Series 651: projection images · sagittal · 1.2mm · 0.63mm/px · 1 of 19 slices shown (2 of 2)]
[im 1/19]
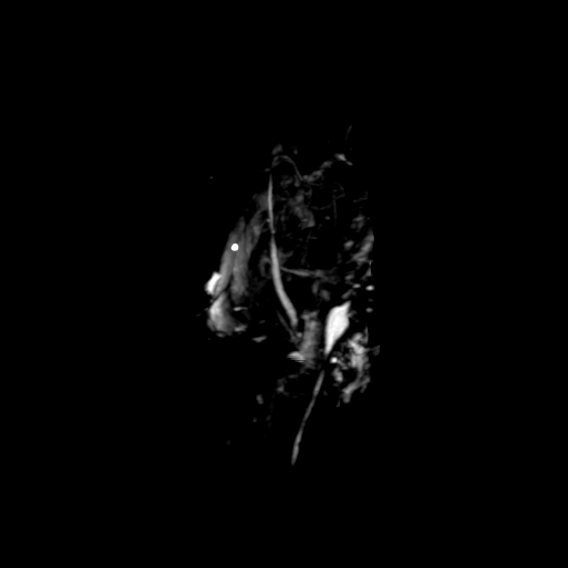

[Series 750: ADC · axial · 8.0mm · 1.56mm/px · 1 of 32 slices shown]
[im 1/32]
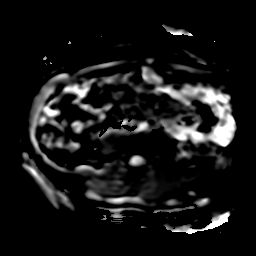

[Series 901: T1 dynamic · axial · 5.0mm · 0.82mm/px · z∈[+13,+291]mm · 2 of 112 slices shown (2 of 5)]
[im 1/112]
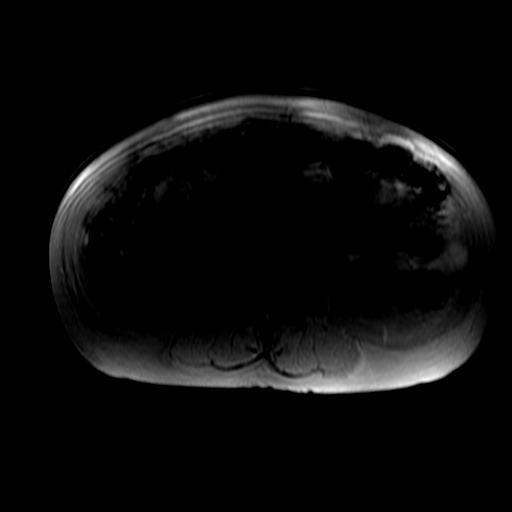
[im 112/112]
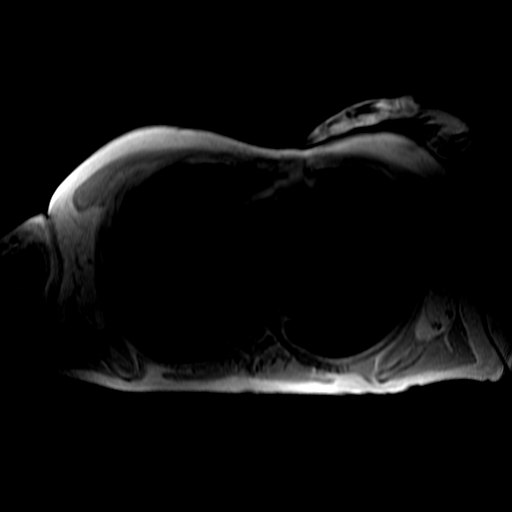

[Series 902: T1 dynamic · axial · 5.0mm · 0.82mm/px · z∈[+13,+291]mm · 2 of 112 slices shown (3 of 5)]
[im 1/112]
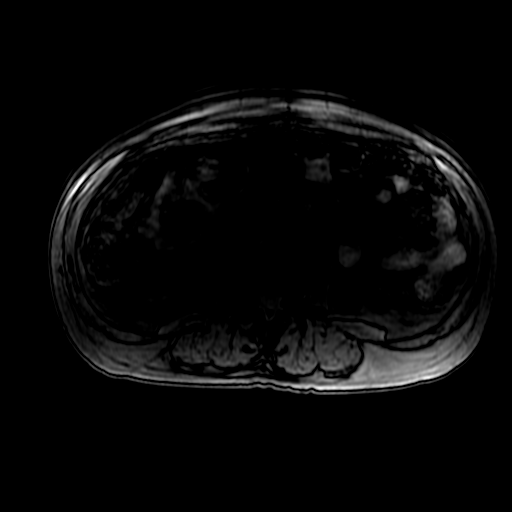
[im 112/112]
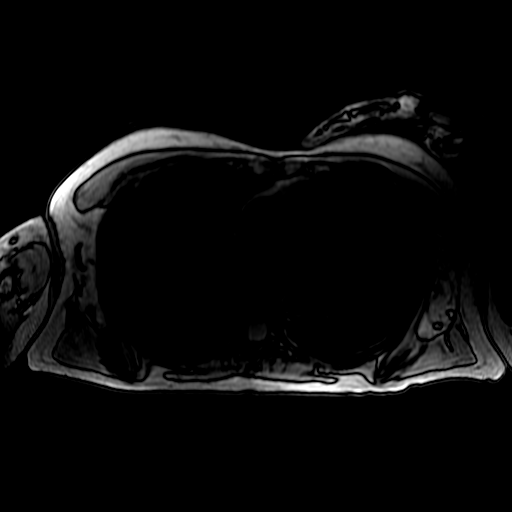

[Series 1001: T1 dynamic · axial · 5.0mm · 0.82mm/px · z∈[+13,+291]mm · 2 of 112 slices shown (4 of 5)]
[im 1/112]
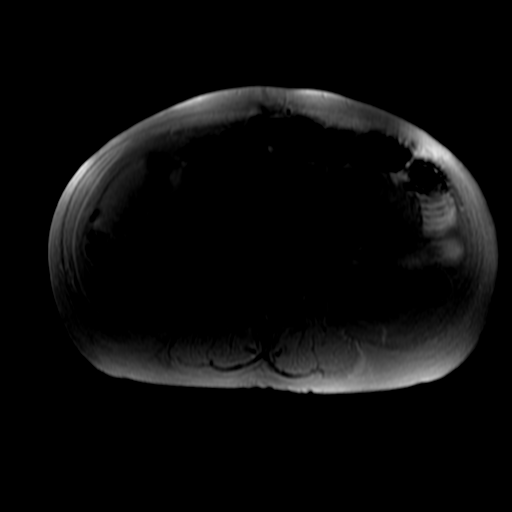
[im 112/112]
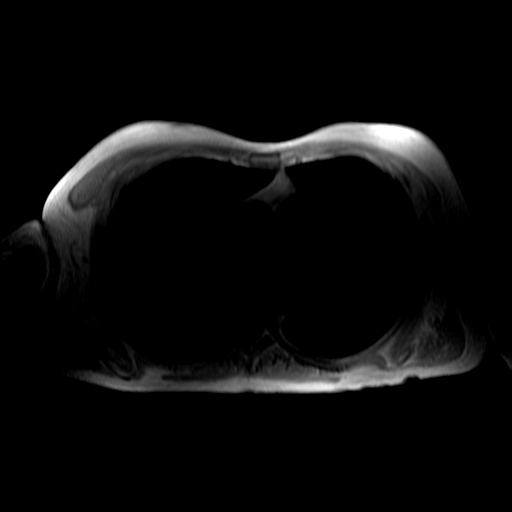

[Series 1002: T1 dynamic · axial · 5.0mm · 0.82mm/px · z∈[+13,+291]mm · 2 of 112 slices shown (5 of 5)]
[im 1/112]
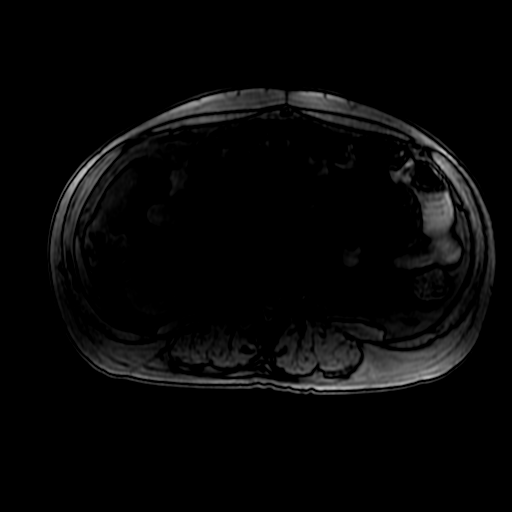
[im 112/112]
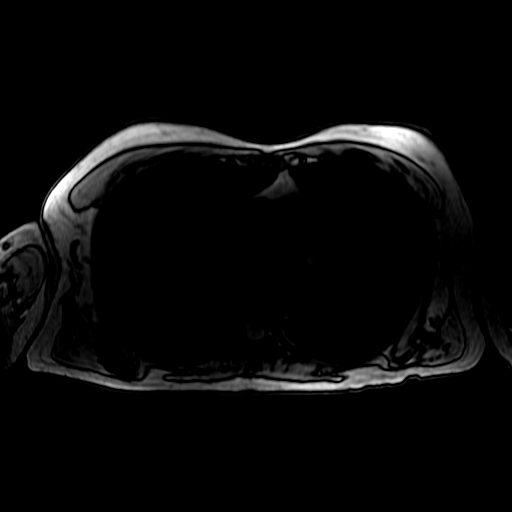

[Series 1300: T1 dynamic post-contrast · axial · non-contrast · 3.9mm · 0.86mm/px · 1 of 140 slices shown]
[im 1/140]
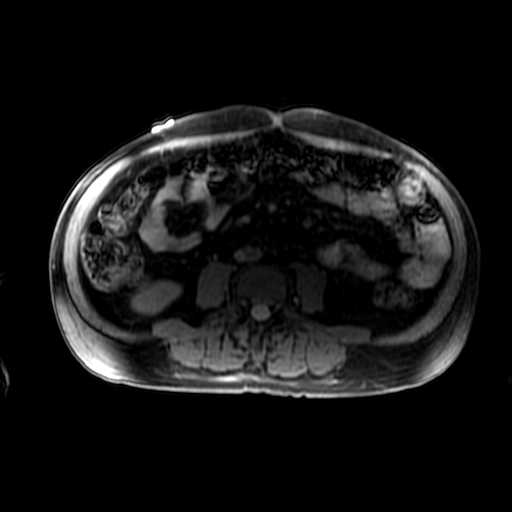

[20 of 48 positions shown; findings below may reference images not displayed]

FINDINGS: Lower chest: The lung bases are grossly clear. No pleural or
pericardial effusion.

Hepatobiliary: Cirrhotic changes involving the liver with abnormal
liver contour, dilated hepatic fissures and increased caudate to
right lobe ratio. There are also changes of hepatic fibrosis and
probable diffuse regenerating nodules. There is also fairly marked
geographic fatty infiltration of the liver. No intrahepatic biliary
dilatation. The portal and hepatic veins are patent. Evidence of
portal venous hypertension and portal venous collaterals with
paraesophageal varices.

I do not see any definite early arterial phase enhancing lesions to
suggest hepatoma or dysplastic nodules. Is an area of somewhat
linear enhancement in the left hepatic lobe which is probably some
type of vascular shunting.

Gallbladder is abnormal. There is fairly marked gallbladder wall
thickening. There is also a small amount of pericholecystic fluid.
Findings could be due to cirrhosis and low albumin. I do not see any
definite gallstones. Normal caliber and course of the common bile
duct.

Pancreas:  No mass, inflammation or ductal dilatation.

Spleen:  Borderline splenic enlargement.  No splenic lesions.

Adrenals/Urinary Tract: The adrenal glands and kidneys are
unremarkable. No worrisome renal lesions or hydronephrosis.

Stomach/Bowel: The stomach, duodenum, visualized small bowel and
visualized colon are grossly normal.

Vascular/Lymphatic: The aorta and branch vessels are patent. The
major venous structures are patent. Portal venous collaterals are
noted. Scattered upper abdominal lymph nodes, typical with
cirrhosis.

Other:  No ascites or abdominal wall hernia.

Musculoskeletal: No significant bony findings.
IMPRESSION: 1. Cirrhotic changes involving the liver with changes of hepatic
fibrosis and probable diffuse regenerating nodules. No definite
early arterial phase enhancing lesions to suggest hepatoma or
dysplastic nodules.
2. Evidence of portal venous hypertension and portal venous
collaterals with paraesophageal varices.
3. Hepatosplenomegaly.
4. Abnormal gallbladder wall thickening and pericholecystic fluid.
Findings could be due to cirrhosis and low albumin. I do not see any
definite gallstones. Normal caliber and course of the common bile
duct. No common bile duct stones.

## 2022-04-14 ENCOUNTER — Ambulatory Visit: Payer: No Typology Code available for payment source | Admitting: Addiction (Substance Use Disorder)

## 2022-04-14 DIAGNOSIS — F1021 Alcohol dependence, in remission: Secondary | ICD-10-CM | POA: Diagnosis not present

## 2022-04-14 DIAGNOSIS — F4310 Post-traumatic stress disorder, unspecified: Secondary | ICD-10-CM
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
## 2022-05-13 ENCOUNTER — Ambulatory Visit (INDEPENDENT_AMBULATORY_CARE_PROVIDER_SITE_OTHER): Payer: No Typology Code available for payment source | Admitting: Addiction (Substance Use Disorder)

## 2022-06-09 ENCOUNTER — Ambulatory Visit (INDEPENDENT_AMBULATORY_CARE_PROVIDER_SITE_OTHER): Payer: 59 | Admitting: Addiction (Substance Use Disorder)

## 2022-06-09 DIAGNOSIS — F4310 Post-traumatic stress disorder, unspecified: Secondary | ICD-10-CM | POA: Diagnosis not present

## 2022-06-09 NOTE — Progress Notes (Signed)
[facility] Counselor/Therapist Progress Note  Patient ID: Paul [name], MRN: [id number],    Date: 06/09/2022  Time Spent: 19mins  Treatment Type: Individual Therapy  Reported Symptoms: stress, trouble juggling things  Mental Status Exam:  Appearance:   Casual and Well Groomed     Behavior:  Appropriate  Motor:  Normal  Speech/Language:   Clear and Coherent  Affect:  Congruent and Full Range  Mood:  normal and stress    Thought process:  circumstantial and tangential  Thought content:    Rumination  Sensory/Perceptual disturbances:    WNL   Orientation:  x4  Attention:  Good  Concentration:  Good  Memory:  WNL  Fund of knowledge:   Good  Insight:    Good  Judgment:   Good  Impulse Control:  Good   Risk Assessment: Danger to Self:  No  Self-injurious Behavior: No Danger to Others: No Duty to Warn:no Physical Aggression / Violence:No  Access to Firearms a concern: No  Gang Involvement:No   Subjective: Client reported consistent stress and trouble juggling life circumstances with trying to help his wife cope with her continued trauma processing with multiple parts/personalities. Client processed feeling like he is participating in 10 different plays at all times: a comedy and tragedy and more. Client reported that it is so hard wearing all the hats and processed his emotions and therapist used MI to validate the struggle of being "on guard" all the time. Client reported not keeping secrets between his wife's different parts and it causing his wife to get triggered/ upset for not having privacy to vent to a friend without her husband getting involved. Therapist used CBT with client to help client process his thoughts about managing the upset of his wife and they decided that each of her parts needs to give permission about what to share to the other part of his wife Amy. Therapist assessed for client safety also and sobriety; Client reported no drinking and denied  SI/HI/AVH.  Interventions: Cognitive Behavioral Therapy and Motivational Interviewing   Diagnosis: No diagnosis found.   Plan of Care: Client to continue getting support from a DID support person for validation of what he is going through.  Client to return for weekly therapy with Sammuel Cooper, therapist, for outpatient therapy, to review again in 3-6 months.  Client is to consider seeing medication provider for support of mood management, triggers and cravings following stabilization of his liver.  Client to follow through with adding more support for them in their SUD txt: engaging ina form of 12 step meetings and meeting with a sponsor/ mentor for daily support/accountability.  Client to engage in [place] txt and RPT relapse prevention therapy AEB coming to therapy weekly and implementing recovery oriented coping strategies to help reduce use of substances and to find relief from symptoms of MH disorders and trauma that cause them to want to escape from reality and numb their mind&body.  Client also to create more stability & structure AEB goal planning with therapist to assist in helping them get into a healthy rhythm/ schedule that can help to calm their nervous system and begin to build more healthy brain neuropathways.  Client to engage in CBT: challenging negative internal ruminations and self-talk AEB expressing toxic thoughts and challenging them with truth.  Client to practice DBT distress tolerance skills (such as distress tolerance and emotion regulation) to practice achieving wise mind and to build support for dealing with cravings AEB learning to ride  the wave of emotion/sensation/etc instead of seeking negative self-soothing techniques: ie using substances to numb self.  Client to utilize BSP (brainspotting) with therapist to help client identify and process triggers for their MH/ SUD with goal of reducing SUDs by 33% each session Client to regulate emotionally to create internal  safety. Progress: Client made progress identifying a feeling that could be a trigger for him to drink and having implemented some coping skills to help him deviate from the though/trigger.  Client grieving and trying to accept his wife's emotional state, despite his fear and frustration.  Client also trying to reduce hypervigilance with little progress due to the trauma still being present in his life and still occurring to his wife.  Client reported minimal progress of his ability to create feelings of personal internal safety.   Barnie Del, LCSW, LCAS, CCTP, CCS-I, BSP

## 2022-08-26 ENCOUNTER — Other Ambulatory Visit: Payer: Self-pay | Admitting: Gastroenterology

## 2022-08-26 DIAGNOSIS — K7030 Alcoholic cirrhosis of liver without ascites: Secondary | ICD-10-CM

## 2022-09-14 ENCOUNTER — Ambulatory Visit
Admission: RE | Admit: 2022-09-14 | Discharge: 2022-09-14 | Disposition: A | Discharge status: Home / Self Care | Payer: Managed Care, Other (non HMO) | Source: Ambulatory Visit | Attending: Gastroenterology | Admitting: Gastroenterology

## 2022-09-14 DIAGNOSIS — K7030 Alcoholic cirrhosis of liver without ascites: Secondary | ICD-10-CM

## 2022-10-25 ENCOUNTER — Encounter: Payer: Self-pay | Admitting: Addiction (Substance Use Disorder)

## 2023-04-05 ENCOUNTER — Other Ambulatory Visit: Payer: Self-pay | Admitting: Gastroenterology

## 2023-04-05 DIAGNOSIS — K7030 Alcoholic cirrhosis of liver without ascites: Secondary | ICD-10-CM

## 2023-04-11 ENCOUNTER — Ambulatory Visit
Admission: RE | Admit: 2023-04-11 | Discharge: 2023-04-11 | Disposition: A | Discharge status: Home / Self Care | Payer: Managed Care, Other (non HMO) | Source: Ambulatory Visit | Attending: Gastroenterology

## 2023-04-11 DIAGNOSIS — K7030 Alcoholic cirrhosis of liver without ascites: Secondary | ICD-10-CM

## 2023-11-11 ENCOUNTER — Other Ambulatory Visit: Payer: Self-pay | Admitting: Gastroenterology

## 2023-11-11 DIAGNOSIS — K7030 Alcoholic cirrhosis of liver without ascites: Secondary | ICD-10-CM

## 2023-12-02 ENCOUNTER — Ambulatory Visit
Admission: RE | Admit: 2023-12-02 | Discharge: 2023-12-02 | Disposition: A | Discharge status: Home / Self Care | Source: Ambulatory Visit | Attending: Gastroenterology | Admitting: Gastroenterology

## 2023-12-02 DIAGNOSIS — K7030 Alcoholic cirrhosis of liver without ascites: Secondary | ICD-10-CM

## 2023-12-02 MED ORDER — IOPAMIDOL (ISOVUE-300) INJECTION 61%
500.0000 mL | Freq: Once | INTRAVENOUS | Status: AC | PRN
  Administered 2023-12-02: 100 mL via INTRAVENOUS

## 2024-01-30 ENCOUNTER — Other Ambulatory Visit: Payer: Self-pay | Admitting: Family Medicine

## 2024-01-30 DIAGNOSIS — M5431 Sciatica, right side: Secondary | ICD-10-CM

## 2024-02-03 ENCOUNTER — Encounter: Payer: Self-pay | Admitting: Family Medicine

## 2024-02-06 ENCOUNTER — Other Ambulatory Visit
